# Patient Record
Sex: Male | Born: 1940
Health system: Southern US, Community
[De-identification: ages and names within clinical notes are randomized; demographics above are authoritative.]

## PROBLEM LIST (undated history)

## (undated) DIAGNOSIS — I10 Essential (primary) hypertension: Secondary | ICD-10-CM

## (undated) DIAGNOSIS — I2510 Atherosclerotic heart disease of native coronary artery without angina pectoris: Secondary | ICD-10-CM

## (undated) DIAGNOSIS — E119 Type 2 diabetes mellitus without complications: Secondary | ICD-10-CM

---

## 1999-01-16 ENCOUNTER — Encounter: Payer: Self-pay | Admitting: Orthopedic Surgery

## 1999-01-16 ENCOUNTER — Encounter: Admission: RE | Admit: 1999-01-16 | Discharge: 1999-01-16 | Payer: Self-pay | Admitting: Orthopedic Surgery

## 1999-01-17 ENCOUNTER — Ambulatory Visit (HOSPITAL_BASED_OUTPATIENT_CLINIC_OR_DEPARTMENT_OTHER): Admission: RE | Admit: 1999-01-17 | Discharge: 1999-01-17 | Payer: Self-pay | Admitting: Orthopedic Surgery

## 2002-12-20 ENCOUNTER — Ambulatory Visit (HOSPITAL_COMMUNITY): Admission: RE | Admit: 2002-12-20 | Discharge: 2002-12-20 | Payer: Self-pay | Admitting: Cardiology

## 2002-12-29 ENCOUNTER — Inpatient Hospital Stay (HOSPITAL_COMMUNITY): Admission: RE | Admit: 2002-12-29 | Discharge: 2003-01-02 | Payer: Self-pay | Admitting: Surgery

## 2003-06-20 ENCOUNTER — Emergency Department (HOSPITAL_COMMUNITY): Admission: EM | Admit: 2003-06-20 | Discharge: 2003-06-20 | Payer: Self-pay | Admitting: Emergency Medicine

## 2011-01-15 DIAGNOSIS — H2510 Age-related nuclear cataract, unspecified eye: Secondary | ICD-10-CM | POA: Diagnosis not present

## 2011-01-15 DIAGNOSIS — E119 Type 2 diabetes mellitus without complications: Secondary | ICD-10-CM | POA: Diagnosis not present

## 2011-01-29 DIAGNOSIS — I10 Essential (primary) hypertension: Secondary | ICD-10-CM | POA: Diagnosis not present

## 2011-01-29 DIAGNOSIS — E119 Type 2 diabetes mellitus without complications: Secondary | ICD-10-CM | POA: Diagnosis not present

## 2011-01-29 DIAGNOSIS — E781 Pure hyperglyceridemia: Secondary | ICD-10-CM | POA: Diagnosis not present

## 2011-01-29 DIAGNOSIS — D649 Anemia, unspecified: Secondary | ICD-10-CM | POA: Diagnosis not present

## 2011-01-29 DIAGNOSIS — Z125 Encounter for screening for malignant neoplasm of prostate: Secondary | ICD-10-CM | POA: Diagnosis not present

## 2011-01-30 DIAGNOSIS — Z1212 Encounter for screening for malignant neoplasm of rectum: Secondary | ICD-10-CM | POA: Diagnosis not present

## 2011-01-31 DIAGNOSIS — K922 Gastrointestinal hemorrhage, unspecified: Secondary | ICD-10-CM | POA: Diagnosis not present

## 2011-01-31 DIAGNOSIS — Z0000 Encounter for general adult medical examination without abnormal findings: Secondary | ICD-10-CM | POA: Diagnosis not present

## 2011-01-31 DIAGNOSIS — M1990 Unspecified osteoarthritis, unspecified site: Secondary | ICD-10-CM | POA: Diagnosis not present

## 2011-01-31 DIAGNOSIS — E119 Type 2 diabetes mellitus without complications: Secondary | ICD-10-CM | POA: Diagnosis not present

## 2011-03-11 DIAGNOSIS — D509 Iron deficiency anemia, unspecified: Secondary | ICD-10-CM | POA: Diagnosis not present

## 2011-03-11 DIAGNOSIS — E119 Type 2 diabetes mellitus without complications: Secondary | ICD-10-CM | POA: Diagnosis not present

## 2011-03-17 DIAGNOSIS — K921 Melena: Secondary | ICD-10-CM | POA: Diagnosis not present

## 2011-03-17 DIAGNOSIS — D500 Iron deficiency anemia secondary to blood loss (chronic): Secondary | ICD-10-CM | POA: Diagnosis not present

## 2011-03-27 DIAGNOSIS — D131 Benign neoplasm of stomach: Secondary | ICD-10-CM | POA: Diagnosis not present

## 2011-03-27 DIAGNOSIS — D126 Benign neoplasm of colon, unspecified: Secondary | ICD-10-CM | POA: Diagnosis not present

## 2011-03-27 DIAGNOSIS — K5730 Diverticulosis of large intestine without perforation or abscess without bleeding: Secondary | ICD-10-CM | POA: Diagnosis not present

## 2011-03-27 DIAGNOSIS — R195 Other fecal abnormalities: Secondary | ICD-10-CM | POA: Diagnosis not present

## 2011-03-27 DIAGNOSIS — D509 Iron deficiency anemia, unspecified: Secondary | ICD-10-CM | POA: Diagnosis not present

## 2011-08-15 DIAGNOSIS — H52209 Unspecified astigmatism, unspecified eye: Secondary | ICD-10-CM | POA: Diagnosis not present

## 2011-08-15 DIAGNOSIS — H2510 Age-related nuclear cataract, unspecified eye: Secondary | ICD-10-CM | POA: Diagnosis not present

## 2011-08-15 DIAGNOSIS — H5210 Myopia, unspecified eye: Secondary | ICD-10-CM | POA: Diagnosis not present

## 2011-08-15 DIAGNOSIS — H25019 Cortical age-related cataract, unspecified eye: Secondary | ICD-10-CM | POA: Diagnosis not present

## 2011-09-09 DIAGNOSIS — H2589 Other age-related cataract: Secondary | ICD-10-CM | POA: Diagnosis not present

## 2011-09-09 DIAGNOSIS — H25019 Cortical age-related cataract, unspecified eye: Secondary | ICD-10-CM | POA: Diagnosis not present

## 2011-09-09 DIAGNOSIS — H2510 Age-related nuclear cataract, unspecified eye: Secondary | ICD-10-CM | POA: Diagnosis not present

## 2011-09-09 DIAGNOSIS — IMO0002 Reserved for concepts with insufficient information to code with codable children: Secondary | ICD-10-CM | POA: Diagnosis not present

## 2011-09-23 DIAGNOSIS — H2589 Other age-related cataract: Secondary | ICD-10-CM | POA: Diagnosis not present

## 2011-09-23 DIAGNOSIS — H2510 Age-related nuclear cataract, unspecified eye: Secondary | ICD-10-CM | POA: Diagnosis not present

## 2011-09-23 DIAGNOSIS — H25019 Cortical age-related cataract, unspecified eye: Secondary | ICD-10-CM | POA: Diagnosis not present

## 2012-01-29 DIAGNOSIS — Z125 Encounter for screening for malignant neoplasm of prostate: Secondary | ICD-10-CM | POA: Diagnosis not present

## 2012-01-29 DIAGNOSIS — E781 Pure hyperglyceridemia: Secondary | ICD-10-CM | POA: Diagnosis not present

## 2012-01-29 DIAGNOSIS — E119 Type 2 diabetes mellitus without complications: Secondary | ICD-10-CM | POA: Diagnosis not present

## 2012-01-29 DIAGNOSIS — I2510 Atherosclerotic heart disease of native coronary artery without angina pectoris: Secondary | ICD-10-CM | POA: Diagnosis not present

## 2012-01-29 DIAGNOSIS — I10 Essential (primary) hypertension: Secondary | ICD-10-CM | POA: Diagnosis not present

## 2012-01-30 DIAGNOSIS — I10 Essential (primary) hypertension: Secondary | ICD-10-CM | POA: Diagnosis not present

## 2012-02-04 DIAGNOSIS — Z1212 Encounter for screening for malignant neoplasm of rectum: Secondary | ICD-10-CM | POA: Diagnosis not present

## 2012-02-05 DIAGNOSIS — K922 Gastrointestinal hemorrhage, unspecified: Secondary | ICD-10-CM | POA: Diagnosis not present

## 2012-02-05 DIAGNOSIS — Z1331 Encounter for screening for depression: Secondary | ICD-10-CM | POA: Diagnosis not present

## 2012-02-05 DIAGNOSIS — Z0000 Encounter for general adult medical examination without abnormal findings: Secondary | ICD-10-CM | POA: Diagnosis not present

## 2012-02-05 DIAGNOSIS — E1129 Type 2 diabetes mellitus with other diabetic kidney complication: Secondary | ICD-10-CM | POA: Diagnosis not present

## 2012-02-05 DIAGNOSIS — Z23 Encounter for immunization: Secondary | ICD-10-CM | POA: Diagnosis not present

## 2012-02-13 DIAGNOSIS — Z23 Encounter for immunization: Secondary | ICD-10-CM | POA: Diagnosis not present

## 2012-06-08 DIAGNOSIS — Z6841 Body Mass Index (BMI) 40.0 and over, adult: Secondary | ICD-10-CM | POA: Diagnosis not present

## 2012-06-08 DIAGNOSIS — M1990 Unspecified osteoarthritis, unspecified site: Secondary | ICD-10-CM | POA: Diagnosis not present

## 2012-06-08 DIAGNOSIS — I872 Venous insufficiency (chronic) (peripheral): Secondary | ICD-10-CM | POA: Diagnosis not present

## 2012-06-08 DIAGNOSIS — R809 Proteinuria, unspecified: Secondary | ICD-10-CM | POA: Diagnosis not present

## 2012-06-08 DIAGNOSIS — E1129 Type 2 diabetes mellitus with other diabetic kidney complication: Secondary | ICD-10-CM | POA: Diagnosis not present

## 2012-06-08 DIAGNOSIS — K922 Gastrointestinal hemorrhage, unspecified: Secondary | ICD-10-CM | POA: Diagnosis not present

## 2012-06-08 DIAGNOSIS — I10 Essential (primary) hypertension: Secondary | ICD-10-CM | POA: Diagnosis not present

## 2012-10-19 DIAGNOSIS — Z23 Encounter for immunization: Secondary | ICD-10-CM | POA: Diagnosis not present

## 2012-10-19 DIAGNOSIS — E1129 Type 2 diabetes mellitus with other diabetic kidney complication: Secondary | ICD-10-CM | POA: Diagnosis not present

## 2012-10-19 DIAGNOSIS — E119 Type 2 diabetes mellitus without complications: Secondary | ICD-10-CM | POA: Diagnosis not present

## 2012-10-19 DIAGNOSIS — M1990 Unspecified osteoarthritis, unspecified site: Secondary | ICD-10-CM | POA: Diagnosis not present

## 2012-10-19 DIAGNOSIS — I10 Essential (primary) hypertension: Secondary | ICD-10-CM | POA: Diagnosis not present

## 2012-10-19 DIAGNOSIS — R809 Proteinuria, unspecified: Secondary | ICD-10-CM | POA: Diagnosis not present

## 2013-02-25 DIAGNOSIS — E1129 Type 2 diabetes mellitus with other diabetic kidney complication: Secondary | ICD-10-CM | POA: Diagnosis not present

## 2013-02-25 DIAGNOSIS — E781 Pure hyperglyceridemia: Secondary | ICD-10-CM | POA: Diagnosis not present

## 2013-02-25 DIAGNOSIS — Z125 Encounter for screening for malignant neoplasm of prostate: Secondary | ICD-10-CM | POA: Diagnosis not present

## 2013-02-25 DIAGNOSIS — I2510 Atherosclerotic heart disease of native coronary artery without angina pectoris: Secondary | ICD-10-CM | POA: Diagnosis not present

## 2013-03-04 DIAGNOSIS — Z0000 Encounter for general adult medical examination without abnormal findings: Secondary | ICD-10-CM | POA: Diagnosis not present

## 2013-03-04 DIAGNOSIS — I2510 Atherosclerotic heart disease of native coronary artery without angina pectoris: Secondary | ICD-10-CM | POA: Diagnosis not present

## 2013-03-04 DIAGNOSIS — I10 Essential (primary) hypertension: Secondary | ICD-10-CM | POA: Diagnosis not present

## 2013-03-04 DIAGNOSIS — M1990 Unspecified osteoarthritis, unspecified site: Secondary | ICD-10-CM | POA: Diagnosis not present

## 2013-03-04 DIAGNOSIS — E781 Pure hyperglyceridemia: Secondary | ICD-10-CM | POA: Diagnosis not present

## 2013-03-04 DIAGNOSIS — E1129 Type 2 diabetes mellitus with other diabetic kidney complication: Secondary | ICD-10-CM | POA: Diagnosis not present

## 2013-03-04 DIAGNOSIS — Z1331 Encounter for screening for depression: Secondary | ICD-10-CM | POA: Diagnosis not present

## 2013-03-04 DIAGNOSIS — F4321 Adjustment disorder with depressed mood: Secondary | ICD-10-CM | POA: Diagnosis not present

## 2013-03-04 DIAGNOSIS — I872 Venous insufficiency (chronic) (peripheral): Secondary | ICD-10-CM | POA: Diagnosis not present

## 2013-03-07 DIAGNOSIS — Z1212 Encounter for screening for malignant neoplasm of rectum: Secondary | ICD-10-CM | POA: Diagnosis not present

## 2013-04-27 ENCOUNTER — Ambulatory Visit (HOSPITAL_COMMUNITY)
Admission: RE | Admit: 2013-04-27 | Discharge: 2013-04-27 | Disposition: A | Discharge status: Home / Self Care | Payer: Medicare Other | Source: Ambulatory Visit | Attending: Vascular Surgery | Admitting: Vascular Surgery

## 2013-04-27 ENCOUNTER — Other Ambulatory Visit (HOSPITAL_COMMUNITY): Payer: Self-pay | Admitting: Internal Medicine

## 2013-04-27 DIAGNOSIS — R0989 Other specified symptoms and signs involving the circulatory and respiratory systems: Secondary | ICD-10-CM | POA: Diagnosis not present

## 2014-02-28 DIAGNOSIS — I10 Essential (primary) hypertension: Secondary | ICD-10-CM | POA: Diagnosis not present

## 2014-02-28 DIAGNOSIS — I2510 Atherosclerotic heart disease of native coronary artery without angina pectoris: Secondary | ICD-10-CM | POA: Diagnosis not present

## 2014-02-28 DIAGNOSIS — E785 Hyperlipidemia, unspecified: Secondary | ICD-10-CM | POA: Diagnosis not present

## 2014-02-28 DIAGNOSIS — Z125 Encounter for screening for malignant neoplasm of prostate: Secondary | ICD-10-CM | POA: Diagnosis not present

## 2014-02-28 DIAGNOSIS — E1129 Type 2 diabetes mellitus with other diabetic kidney complication: Secondary | ICD-10-CM | POA: Diagnosis not present

## 2014-03-07 DIAGNOSIS — I872 Venous insufficiency (chronic) (peripheral): Secondary | ICD-10-CM | POA: Diagnosis not present

## 2014-03-07 DIAGNOSIS — G459 Transient cerebral ischemic attack, unspecified: Secondary | ICD-10-CM | POA: Diagnosis not present

## 2014-03-07 DIAGNOSIS — K635 Polyp of colon: Secondary | ICD-10-CM | POA: Insufficient documentation

## 2014-03-07 DIAGNOSIS — I2510 Atherosclerotic heart disease of native coronary artery without angina pectoris: Secondary | ICD-10-CM | POA: Diagnosis not present

## 2014-03-07 DIAGNOSIS — E781 Pure hyperglyceridemia: Secondary | ICD-10-CM | POA: Diagnosis not present

## 2014-03-07 DIAGNOSIS — F4321 Adjustment disorder with depressed mood: Secondary | ICD-10-CM | POA: Diagnosis not present

## 2014-03-07 DIAGNOSIS — Z1389 Encounter for screening for other disorder: Secondary | ICD-10-CM | POA: Diagnosis not present

## 2014-03-07 DIAGNOSIS — Z23 Encounter for immunization: Secondary | ICD-10-CM | POA: Diagnosis not present

## 2014-03-07 DIAGNOSIS — Z008 Encounter for other general examination: Secondary | ICD-10-CM | POA: Diagnosis not present

## 2014-03-07 DIAGNOSIS — I10 Essential (primary) hypertension: Secondary | ICD-10-CM | POA: Diagnosis not present

## 2014-03-08 DIAGNOSIS — Z1212 Encounter for screening for malignant neoplasm of rectum: Secondary | ICD-10-CM | POA: Diagnosis not present

## 2015-03-28 DIAGNOSIS — E781 Pure hyperglyceridemia: Secondary | ICD-10-CM | POA: Diagnosis not present

## 2015-03-28 DIAGNOSIS — E1129 Type 2 diabetes mellitus with other diabetic kidney complication: Secondary | ICD-10-CM | POA: Diagnosis not present

## 2015-03-28 DIAGNOSIS — Z125 Encounter for screening for malignant neoplasm of prostate: Secondary | ICD-10-CM | POA: Diagnosis not present

## 2015-03-28 DIAGNOSIS — I10 Essential (primary) hypertension: Secondary | ICD-10-CM | POA: Diagnosis not present

## 2015-03-28 DIAGNOSIS — Z0000 Encounter for general adult medical examination without abnormal findings: Secondary | ICD-10-CM | POA: Insufficient documentation

## 2015-04-04 DIAGNOSIS — E781 Pure hyperglyceridemia: Secondary | ICD-10-CM | POA: Diagnosis not present

## 2015-04-04 DIAGNOSIS — I10 Essential (primary) hypertension: Secondary | ICD-10-CM | POA: Diagnosis not present

## 2015-04-04 DIAGNOSIS — I6523 Occlusion and stenosis of bilateral carotid arteries: Secondary | ICD-10-CM | POA: Diagnosis not present

## 2015-04-04 DIAGNOSIS — E1159 Type 2 diabetes mellitus with other circulatory complications: Secondary | ICD-10-CM | POA: Diagnosis not present

## 2015-04-04 DIAGNOSIS — G458 Other transient cerebral ischemic attacks and related syndromes: Secondary | ICD-10-CM | POA: Diagnosis not present

## 2015-04-04 DIAGNOSIS — M1990 Unspecified osteoarthritis, unspecified site: Secondary | ICD-10-CM | POA: Diagnosis not present

## 2015-04-04 DIAGNOSIS — Z0000 Encounter for general adult medical examination without abnormal findings: Secondary | ICD-10-CM | POA: Diagnosis not present

## 2015-04-04 DIAGNOSIS — F39 Unspecified mood [affective] disorder: Secondary | ICD-10-CM | POA: Insufficient documentation

## 2015-04-04 DIAGNOSIS — I209 Angina pectoris, unspecified: Secondary | ICD-10-CM | POA: Insufficient documentation

## 2015-04-04 DIAGNOSIS — Z1389 Encounter for screening for other disorder: Secondary | ICD-10-CM | POA: Diagnosis not present

## 2015-04-04 DIAGNOSIS — Z6841 Body Mass Index (BMI) 40.0 and over, adult: Secondary | ICD-10-CM | POA: Diagnosis not present

## 2015-04-04 DIAGNOSIS — I872 Venous insufficiency (chronic) (peripheral): Secondary | ICD-10-CM | POA: Diagnosis not present

## 2015-04-04 DIAGNOSIS — I25119 Atherosclerotic heart disease of native coronary artery with unspecified angina pectoris: Secondary | ICD-10-CM | POA: Diagnosis not present

## 2015-04-11 DIAGNOSIS — Z1212 Encounter for screening for malignant neoplasm of rectum: Secondary | ICD-10-CM | POA: Diagnosis not present

## 2015-05-03 DIAGNOSIS — Z6841 Body Mass Index (BMI) 40.0 and over, adult: Secondary | ICD-10-CM | POA: Diagnosis not present

## 2015-05-03 DIAGNOSIS — I2510 Atherosclerotic heart disease of native coronary artery without angina pectoris: Secondary | ICD-10-CM | POA: Diagnosis not present

## 2015-05-03 DIAGNOSIS — I10 Essential (primary) hypertension: Secondary | ICD-10-CM | POA: Diagnosis not present

## 2015-05-03 DIAGNOSIS — E1159 Type 2 diabetes mellitus with other circulatory complications: Secondary | ICD-10-CM | POA: Diagnosis not present

## 2015-08-01 DIAGNOSIS — E781 Pure hyperglyceridemia: Secondary | ICD-10-CM | POA: Diagnosis not present

## 2015-08-01 DIAGNOSIS — E1159 Type 2 diabetes mellitus with other circulatory complications: Secondary | ICD-10-CM | POA: Diagnosis not present

## 2015-08-01 DIAGNOSIS — F39 Unspecified mood [affective] disorder: Secondary | ICD-10-CM | POA: Diagnosis not present

## 2015-08-01 DIAGNOSIS — M1990 Unspecified osteoarthritis, unspecified site: Secondary | ICD-10-CM | POA: Diagnosis not present

## 2015-08-01 DIAGNOSIS — I25119 Atherosclerotic heart disease of native coronary artery with unspecified angina pectoris: Secondary | ICD-10-CM | POA: Diagnosis not present

## 2015-09-05 DIAGNOSIS — Z0100 Encounter for examination of eyes and vision without abnormal findings: Secondary | ICD-10-CM | POA: Diagnosis not present

## 2015-09-05 DIAGNOSIS — E119 Type 2 diabetes mellitus without complications: Secondary | ICD-10-CM | POA: Diagnosis not present

## 2015-09-05 DIAGNOSIS — Z961 Presence of intraocular lens: Secondary | ICD-10-CM | POA: Diagnosis not present

## 2015-12-10 DIAGNOSIS — I25119 Atherosclerotic heart disease of native coronary artery with unspecified angina pectoris: Secondary | ICD-10-CM | POA: Diagnosis not present

## 2015-12-10 DIAGNOSIS — E1151 Type 2 diabetes mellitus with diabetic peripheral angiopathy without gangrene: Secondary | ICD-10-CM | POA: Diagnosis not present

## 2015-12-10 DIAGNOSIS — Z6839 Body mass index (BMI) 39.0-39.9, adult: Secondary | ICD-10-CM | POA: Diagnosis not present

## 2015-12-10 DIAGNOSIS — I10 Essential (primary) hypertension: Secondary | ICD-10-CM | POA: Diagnosis not present

## 2015-12-10 DIAGNOSIS — M1990 Unspecified osteoarthritis, unspecified site: Secondary | ICD-10-CM | POA: Diagnosis not present

## 2015-12-10 DIAGNOSIS — E781 Pure hyperglyceridemia: Secondary | ICD-10-CM | POA: Diagnosis not present

## 2015-12-10 DIAGNOSIS — F39 Unspecified mood [affective] disorder: Secondary | ICD-10-CM | POA: Diagnosis not present

## 2015-12-10 DIAGNOSIS — E1159 Type 2 diabetes mellitus with other circulatory complications: Secondary | ICD-10-CM | POA: Diagnosis not present

## 2016-04-09 DIAGNOSIS — Z125 Encounter for screening for malignant neoplasm of prostate: Secondary | ICD-10-CM | POA: Diagnosis not present

## 2016-04-09 DIAGNOSIS — I10 Essential (primary) hypertension: Secondary | ICD-10-CM | POA: Diagnosis not present

## 2016-04-09 DIAGNOSIS — E1159 Type 2 diabetes mellitus with other circulatory complications: Secondary | ICD-10-CM | POA: Diagnosis not present

## 2016-04-09 DIAGNOSIS — E781 Pure hyperglyceridemia: Secondary | ICD-10-CM | POA: Diagnosis not present

## 2016-04-16 DIAGNOSIS — Z6838 Body mass index (BMI) 38.0-38.9, adult: Secondary | ICD-10-CM | POA: Diagnosis not present

## 2016-04-16 DIAGNOSIS — E1151 Type 2 diabetes mellitus with diabetic peripheral angiopathy without gangrene: Secondary | ICD-10-CM | POA: Diagnosis not present

## 2016-04-16 DIAGNOSIS — I25119 Atherosclerotic heart disease of native coronary artery with unspecified angina pectoris: Secondary | ICD-10-CM | POA: Diagnosis not present

## 2016-04-16 DIAGNOSIS — I6523 Occlusion and stenosis of bilateral carotid arteries: Secondary | ICD-10-CM | POA: Diagnosis not present

## 2016-04-16 DIAGNOSIS — K635 Polyp of colon: Secondary | ICD-10-CM | POA: Diagnosis not present

## 2016-04-16 DIAGNOSIS — I872 Venous insufficiency (chronic) (peripheral): Secondary | ICD-10-CM | POA: Diagnosis not present

## 2016-04-16 DIAGNOSIS — Z1389 Encounter for screening for other disorder: Secondary | ICD-10-CM | POA: Diagnosis not present

## 2016-04-16 DIAGNOSIS — F39 Unspecified mood [affective] disorder: Secondary | ICD-10-CM | POA: Diagnosis not present

## 2016-04-16 DIAGNOSIS — M1990 Unspecified osteoarthritis, unspecified site: Secondary | ICD-10-CM | POA: Diagnosis not present

## 2016-04-16 DIAGNOSIS — Z0000 Encounter for general adult medical examination without abnormal findings: Secondary | ICD-10-CM | POA: Diagnosis not present

## 2016-04-16 DIAGNOSIS — E781 Pure hyperglyceridemia: Secondary | ICD-10-CM | POA: Diagnosis not present

## 2016-04-16 DIAGNOSIS — Z23 Encounter for immunization: Secondary | ICD-10-CM | POA: Diagnosis not present

## 2016-04-25 DIAGNOSIS — Z1212 Encounter for screening for malignant neoplasm of rectum: Secondary | ICD-10-CM | POA: Diagnosis not present

## 2016-09-11 DIAGNOSIS — E781 Pure hyperglyceridemia: Secondary | ICD-10-CM | POA: Diagnosis not present

## 2016-09-11 DIAGNOSIS — Z6839 Body mass index (BMI) 39.0-39.9, adult: Secondary | ICD-10-CM | POA: Diagnosis not present

## 2016-09-11 DIAGNOSIS — E1151 Type 2 diabetes mellitus with diabetic peripheral angiopathy without gangrene: Secondary | ICD-10-CM | POA: Diagnosis not present

## 2016-09-11 DIAGNOSIS — I25119 Atherosclerotic heart disease of native coronary artery with unspecified angina pectoris: Secondary | ICD-10-CM | POA: Diagnosis not present

## 2016-09-11 DIAGNOSIS — I10 Essential (primary) hypertension: Secondary | ICD-10-CM | POA: Diagnosis not present

## 2017-01-14 DIAGNOSIS — H5203 Hypermetropia, bilateral: Secondary | ICD-10-CM | POA: Diagnosis not present

## 2017-01-14 DIAGNOSIS — E119 Type 2 diabetes mellitus without complications: Secondary | ICD-10-CM | POA: Diagnosis not present

## 2017-01-14 DIAGNOSIS — Z961 Presence of intraocular lens: Secondary | ICD-10-CM | POA: Diagnosis not present

## 2017-01-22 DIAGNOSIS — I10 Essential (primary) hypertension: Secondary | ICD-10-CM | POA: Diagnosis not present

## 2017-01-22 DIAGNOSIS — M1990 Unspecified osteoarthritis, unspecified site: Secondary | ICD-10-CM | POA: Diagnosis not present

## 2017-01-22 DIAGNOSIS — E781 Pure hyperglyceridemia: Secondary | ICD-10-CM | POA: Diagnosis not present

## 2017-01-22 DIAGNOSIS — E1151 Type 2 diabetes mellitus with diabetic peripheral angiopathy without gangrene: Secondary | ICD-10-CM | POA: Diagnosis not present

## 2017-01-22 DIAGNOSIS — Z1389 Encounter for screening for other disorder: Secondary | ICD-10-CM | POA: Diagnosis not present

## 2017-01-22 DIAGNOSIS — I25119 Atherosclerotic heart disease of native coronary artery with unspecified angina pectoris: Secondary | ICD-10-CM | POA: Diagnosis not present

## 2017-01-22 DIAGNOSIS — Z6839 Body mass index (BMI) 39.0-39.9, adult: Secondary | ICD-10-CM | POA: Diagnosis not present

## 2017-04-17 DIAGNOSIS — Z125 Encounter for screening for malignant neoplasm of prostate: Secondary | ICD-10-CM | POA: Diagnosis not present

## 2017-04-17 DIAGNOSIS — E1151 Type 2 diabetes mellitus with diabetic peripheral angiopathy without gangrene: Secondary | ICD-10-CM | POA: Diagnosis not present

## 2017-04-17 DIAGNOSIS — R82998 Other abnormal findings in urine: Secondary | ICD-10-CM | POA: Diagnosis not present

## 2017-04-17 DIAGNOSIS — I10 Essential (primary) hypertension: Secondary | ICD-10-CM | POA: Diagnosis not present

## 2017-04-24 DIAGNOSIS — Z6841 Body Mass Index (BMI) 40.0 and over, adult: Secondary | ICD-10-CM | POA: Diagnosis not present

## 2017-04-24 DIAGNOSIS — Z8673 Personal history of transient ischemic attack (TIA), and cerebral infarction without residual deficits: Secondary | ICD-10-CM | POA: Diagnosis not present

## 2017-04-24 DIAGNOSIS — I25119 Atherosclerotic heart disease of native coronary artery with unspecified angina pectoris: Secondary | ICD-10-CM | POA: Diagnosis not present

## 2017-04-24 DIAGNOSIS — F39 Unspecified mood [affective] disorder: Secondary | ICD-10-CM | POA: Diagnosis not present

## 2017-04-24 DIAGNOSIS — Z0000 Encounter for general adult medical examination without abnormal findings: Secondary | ICD-10-CM | POA: Diagnosis not present

## 2017-04-24 DIAGNOSIS — I872 Venous insufficiency (chronic) (peripheral): Secondary | ICD-10-CM | POA: Diagnosis not present

## 2017-04-24 DIAGNOSIS — E1151 Type 2 diabetes mellitus with diabetic peripheral angiopathy without gangrene: Secondary | ICD-10-CM | POA: Diagnosis not present

## 2017-04-24 DIAGNOSIS — I10 Essential (primary) hypertension: Secondary | ICD-10-CM | POA: Diagnosis not present

## 2017-04-24 DIAGNOSIS — I6523 Occlusion and stenosis of bilateral carotid arteries: Secondary | ICD-10-CM | POA: Diagnosis not present

## 2017-04-24 DIAGNOSIS — E781 Pure hyperglyceridemia: Secondary | ICD-10-CM | POA: Diagnosis not present

## 2017-04-24 DIAGNOSIS — K635 Polyp of colon: Secondary | ICD-10-CM | POA: Diagnosis not present

## 2017-04-27 DIAGNOSIS — Z1212 Encounter for screening for malignant neoplasm of rectum: Secondary | ICD-10-CM | POA: Diagnosis not present

## 2017-05-04 ENCOUNTER — Other Ambulatory Visit (HOSPITAL_COMMUNITY): Payer: Self-pay | Admitting: Internal Medicine

## 2017-05-04 DIAGNOSIS — I6529 Occlusion and stenosis of unspecified carotid artery: Secondary | ICD-10-CM

## 2017-05-05 ENCOUNTER — Ambulatory Visit (HOSPITAL_COMMUNITY)
Admission: RE | Admit: 2017-05-05 | Discharge: 2017-05-05 | Disposition: A | Discharge status: Home / Self Care | Payer: PPO | Source: Ambulatory Visit | Attending: Vascular Surgery | Admitting: Vascular Surgery

## 2017-05-05 DIAGNOSIS — I6529 Occlusion and stenosis of unspecified carotid artery: Secondary | ICD-10-CM | POA: Diagnosis not present

## 2017-09-03 DIAGNOSIS — F1120 Opioid dependence, uncomplicated: Secondary | ICD-10-CM | POA: Insufficient documentation

## 2017-09-03 DIAGNOSIS — I10 Essential (primary) hypertension: Secondary | ICD-10-CM | POA: Diagnosis not present

## 2017-09-03 DIAGNOSIS — I25119 Atherosclerotic heart disease of native coronary artery with unspecified angina pectoris: Secondary | ICD-10-CM | POA: Diagnosis not present

## 2017-09-03 DIAGNOSIS — F39 Unspecified mood [affective] disorder: Secondary | ICD-10-CM | POA: Diagnosis not present

## 2017-09-03 DIAGNOSIS — Z6841 Body Mass Index (BMI) 40.0 and over, adult: Secondary | ICD-10-CM | POA: Diagnosis not present

## 2017-09-03 DIAGNOSIS — E1151 Type 2 diabetes mellitus with diabetic peripheral angiopathy without gangrene: Secondary | ICD-10-CM | POA: Diagnosis not present

## 2017-09-03 DIAGNOSIS — M1990 Unspecified osteoarthritis, unspecified site: Secondary | ICD-10-CM | POA: Diagnosis not present

## 2017-09-03 DIAGNOSIS — I6523 Occlusion and stenosis of bilateral carotid arteries: Secondary | ICD-10-CM | POA: Diagnosis not present

## 2018-05-10 DIAGNOSIS — R82998 Other abnormal findings in urine: Secondary | ICD-10-CM | POA: Diagnosis not present

## 2018-05-10 DIAGNOSIS — E781 Pure hyperglyceridemia: Secondary | ICD-10-CM | POA: Diagnosis not present

## 2018-05-10 DIAGNOSIS — Z125 Encounter for screening for malignant neoplasm of prostate: Secondary | ICD-10-CM | POA: Diagnosis not present

## 2018-05-10 DIAGNOSIS — I10 Essential (primary) hypertension: Secondary | ICD-10-CM | POA: Diagnosis not present

## 2018-05-10 DIAGNOSIS — E1151 Type 2 diabetes mellitus with diabetic peripheral angiopathy without gangrene: Secondary | ICD-10-CM | POA: Diagnosis not present

## 2018-05-17 DIAGNOSIS — Z1331 Encounter for screening for depression: Secondary | ICD-10-CM | POA: Diagnosis not present

## 2018-05-17 DIAGNOSIS — Z1339 Encounter for screening examination for other mental health and behavioral disorders: Secondary | ICD-10-CM | POA: Diagnosis not present

## 2018-05-17 DIAGNOSIS — E781 Pure hyperglyceridemia: Secondary | ICD-10-CM | POA: Diagnosis not present

## 2018-05-17 DIAGNOSIS — I10 Essential (primary) hypertension: Secondary | ICD-10-CM | POA: Diagnosis not present

## 2018-05-17 DIAGNOSIS — G459 Transient cerebral ischemic attack, unspecified: Secondary | ICD-10-CM | POA: Diagnosis not present

## 2018-05-17 DIAGNOSIS — M1990 Unspecified osteoarthritis, unspecified site: Secondary | ICD-10-CM | POA: Diagnosis not present

## 2018-05-17 DIAGNOSIS — I25119 Atherosclerotic heart disease of native coronary artery with unspecified angina pectoris: Secondary | ICD-10-CM | POA: Diagnosis not present

## 2018-05-17 DIAGNOSIS — I6523 Occlusion and stenosis of bilateral carotid arteries: Secondary | ICD-10-CM | POA: Diagnosis not present

## 2018-05-17 DIAGNOSIS — F1120 Opioid dependence, uncomplicated: Secondary | ICD-10-CM | POA: Diagnosis not present

## 2018-05-17 DIAGNOSIS — I872 Venous insufficiency (chronic) (peripheral): Secondary | ICD-10-CM | POA: Diagnosis not present

## 2018-05-17 DIAGNOSIS — Z0000 Encounter for general adult medical examination without abnormal findings: Secondary | ICD-10-CM | POA: Diagnosis not present

## 2018-05-17 DIAGNOSIS — E1151 Type 2 diabetes mellitus with diabetic peripheral angiopathy without gangrene: Secondary | ICD-10-CM | POA: Diagnosis not present

## 2018-05-17 DIAGNOSIS — F39 Unspecified mood [affective] disorder: Secondary | ICD-10-CM | POA: Diagnosis not present

## 2018-05-17 DIAGNOSIS — N390 Urinary tract infection, site not specified: Secondary | ICD-10-CM | POA: Insufficient documentation

## 2018-08-25 ENCOUNTER — Encounter: Payer: Self-pay | Admitting: Podiatry

## 2018-08-25 ENCOUNTER — Ambulatory Visit: Payer: PPO | Admitting: Podiatry

## 2018-08-25 ENCOUNTER — Other Ambulatory Visit: Payer: Self-pay

## 2018-08-25 DIAGNOSIS — E1140 Type 2 diabetes mellitus with diabetic neuropathy, unspecified: Secondary | ICD-10-CM | POA: Insufficient documentation

## 2018-08-25 DIAGNOSIS — E1142 Type 2 diabetes mellitus with diabetic polyneuropathy: Secondary | ICD-10-CM

## 2018-08-25 NOTE — Progress Notes (Signed)
This patients presents to the office saying his feet feel funny when walking.  He says he needs supports in his shoes to balance his feet.  Patient says  he has diabetic neuropathy.  He presents to the office to discuss insoles for his feet. Patient presents to the office with his wife.  Vascular  Dorsalis pedis and posterior tibial pulses are palpable  B/L.  Capillary return  WNL.  Temperature gradient is  WNL.  Skin turgor  WNL  Sensorium  Senn Weinstein monofilament wire test is absent.. Absent tactile sensation.  Nail Exam  Patient has normal nails with no evidence of bacterial or fungal infection.  Orthopedic  Exam  Muscle tone and muscle strength  WNL.  No limitations of motion feet  B/L.  No crepitus or joint effusion noted.  Hammer toes  B/L.  Pes planus.  Pain in her  Skin  No open lesions.  Normal skin texture and turgor.  Pes planus  Diabetic neuropathy  IE.  Discussed this condition with this patient.  Examined his shoes and history is same more than adequate.  Told him that he would benefit from receiving diabetic insoles to be placed in his shoes.  He was told he does qualify for diabetic shoes due to DPN and pes planus and hammer toes.  Patient was told he needs to be seen by his medical doctor prior to receiving diabetic shoes.  Patient to make an appointment with Liliane Channel.   Gardiner Barefoot DPM  .

## 2018-09-09 ENCOUNTER — Other Ambulatory Visit: Payer: PPO | Admitting: Orthotics

## 2018-10-08 ENCOUNTER — Ambulatory Visit: Payer: Medicare Other | Admitting: Podiatry

## 2018-11-24 DIAGNOSIS — E781 Pure hyperglyceridemia: Secondary | ICD-10-CM | POA: Diagnosis not present

## 2018-11-24 DIAGNOSIS — I25119 Atherosclerotic heart disease of native coronary artery with unspecified angina pectoris: Secondary | ICD-10-CM | POA: Diagnosis not present

## 2018-11-24 DIAGNOSIS — I10 Essential (primary) hypertension: Secondary | ICD-10-CM | POA: Diagnosis not present

## 2018-11-24 DIAGNOSIS — M1990 Unspecified osteoarthritis, unspecified site: Secondary | ICD-10-CM | POA: Diagnosis not present

## 2018-11-24 DIAGNOSIS — E1151 Type 2 diabetes mellitus with diabetic peripheral angiopathy without gangrene: Secondary | ICD-10-CM | POA: Diagnosis not present

## 2018-11-24 DIAGNOSIS — F39 Unspecified mood [affective] disorder: Secondary | ICD-10-CM | POA: Diagnosis not present

## 2019-01-21 DIAGNOSIS — E119 Type 2 diabetes mellitus without complications: Secondary | ICD-10-CM | POA: Diagnosis not present

## 2019-01-21 DIAGNOSIS — Z961 Presence of intraocular lens: Secondary | ICD-10-CM | POA: Diagnosis not present

## 2019-01-21 DIAGNOSIS — H26493 Other secondary cataract, bilateral: Secondary | ICD-10-CM | POA: Diagnosis not present

## 2019-05-20 DIAGNOSIS — E1151 Type 2 diabetes mellitus with diabetic peripheral angiopathy without gangrene: Secondary | ICD-10-CM | POA: Diagnosis not present

## 2019-05-20 DIAGNOSIS — Z125 Encounter for screening for malignant neoplasm of prostate: Secondary | ICD-10-CM | POA: Diagnosis not present

## 2019-05-20 DIAGNOSIS — E781 Pure hyperglyceridemia: Secondary | ICD-10-CM | POA: Diagnosis not present

## 2019-05-23 DIAGNOSIS — R82998 Other abnormal findings in urine: Secondary | ICD-10-CM | POA: Diagnosis not present

## 2019-05-23 DIAGNOSIS — I10 Essential (primary) hypertension: Secondary | ICD-10-CM | POA: Diagnosis not present

## 2019-05-23 DIAGNOSIS — Z1212 Encounter for screening for malignant neoplasm of rectum: Secondary | ICD-10-CM | POA: Diagnosis not present

## 2019-05-25 DIAGNOSIS — F39 Unspecified mood [affective] disorder: Secondary | ICD-10-CM | POA: Diagnosis not present

## 2019-05-25 DIAGNOSIS — I6523 Occlusion and stenosis of bilateral carotid arteries: Secondary | ICD-10-CM | POA: Diagnosis not present

## 2019-05-25 DIAGNOSIS — I10 Essential (primary) hypertension: Secondary | ICD-10-CM | POA: Diagnosis not present

## 2019-05-25 DIAGNOSIS — E1151 Type 2 diabetes mellitus with diabetic peripheral angiopathy without gangrene: Secondary | ICD-10-CM | POA: Diagnosis not present

## 2019-05-25 DIAGNOSIS — I25119 Atherosclerotic heart disease of native coronary artery with unspecified angina pectoris: Secondary | ICD-10-CM | POA: Diagnosis not present

## 2019-05-25 DIAGNOSIS — F1120 Opioid dependence, uncomplicated: Secondary | ICD-10-CM | POA: Diagnosis not present

## 2019-05-25 DIAGNOSIS — E781 Pure hyperglyceridemia: Secondary | ICD-10-CM | POA: Diagnosis not present

## 2019-05-25 DIAGNOSIS — I872 Venous insufficiency (chronic) (peripheral): Secondary | ICD-10-CM | POA: Diagnosis not present

## 2019-05-25 DIAGNOSIS — Z1331 Encounter for screening for depression: Secondary | ICD-10-CM | POA: Diagnosis not present

## 2019-05-25 DIAGNOSIS — M1990 Unspecified osteoarthritis, unspecified site: Secondary | ICD-10-CM | POA: Diagnosis not present

## 2019-05-25 DIAGNOSIS — Z0000 Encounter for general adult medical examination without abnormal findings: Secondary | ICD-10-CM | POA: Diagnosis not present

## 2019-05-25 DIAGNOSIS — G459 Transient cerebral ischemic attack, unspecified: Secondary | ICD-10-CM | POA: Diagnosis not present

## 2019-12-30 DIAGNOSIS — I25119 Atherosclerotic heart disease of native coronary artery with unspecified angina pectoris: Secondary | ICD-10-CM | POA: Diagnosis not present

## 2019-12-30 DIAGNOSIS — G459 Transient cerebral ischemic attack, unspecified: Secondary | ICD-10-CM | POA: Diagnosis not present

## 2019-12-30 DIAGNOSIS — F39 Unspecified mood [affective] disorder: Secondary | ICD-10-CM | POA: Diagnosis not present

## 2019-12-30 DIAGNOSIS — F1120 Opioid dependence, uncomplicated: Secondary | ICD-10-CM | POA: Diagnosis not present

## 2019-12-30 DIAGNOSIS — K635 Polyp of colon: Secondary | ICD-10-CM | POA: Diagnosis not present

## 2019-12-30 DIAGNOSIS — E781 Pure hyperglyceridemia: Secondary | ICD-10-CM | POA: Diagnosis not present

## 2019-12-30 DIAGNOSIS — M1990 Unspecified osteoarthritis, unspecified site: Secondary | ICD-10-CM | POA: Diagnosis not present

## 2019-12-30 DIAGNOSIS — I872 Venous insufficiency (chronic) (peripheral): Secondary | ICD-10-CM | POA: Diagnosis not present

## 2019-12-30 DIAGNOSIS — I10 Essential (primary) hypertension: Secondary | ICD-10-CM | POA: Diagnosis not present

## 2019-12-30 DIAGNOSIS — E1151 Type 2 diabetes mellitus with diabetic peripheral angiopathy without gangrene: Secondary | ICD-10-CM | POA: Diagnosis not present

## 2020-01-26 DIAGNOSIS — E113291 Type 2 diabetes mellitus with mild nonproliferative diabetic retinopathy without macular edema, right eye: Secondary | ICD-10-CM | POA: Diagnosis not present

## 2020-01-26 DIAGNOSIS — H26493 Other secondary cataract, bilateral: Secondary | ICD-10-CM | POA: Diagnosis not present

## 2020-01-26 DIAGNOSIS — Z961 Presence of intraocular lens: Secondary | ICD-10-CM | POA: Diagnosis not present

## 2020-05-28 ENCOUNTER — Encounter: Payer: Self-pay | Admitting: Emergency Medicine

## 2020-05-28 ENCOUNTER — Inpatient Hospital Stay
Admission: EM | Admit: 2020-05-28 | Discharge: 2020-05-30 | DRG: 871 | Disposition: A | Discharge status: Home Health | Payer: PPO | Attending: Internal Medicine | Admitting: Internal Medicine

## 2020-05-28 ENCOUNTER — Emergency Department: Payer: PPO

## 2020-05-28 ENCOUNTER — Other Ambulatory Visit: Payer: Self-pay

## 2020-05-28 DIAGNOSIS — Z7984 Long term (current) use of oral hypoglycemic drugs: Secondary | ICD-10-CM

## 2020-05-28 DIAGNOSIS — R0902 Hypoxemia: Secondary | ICD-10-CM | POA: Diagnosis not present

## 2020-05-28 DIAGNOSIS — E785 Hyperlipidemia, unspecified: Secondary | ICD-10-CM | POA: Diagnosis not present

## 2020-05-28 DIAGNOSIS — N179 Acute kidney failure, unspecified: Secondary | ICD-10-CM | POA: Diagnosis not present

## 2020-05-28 DIAGNOSIS — E119 Type 2 diabetes mellitus without complications: Secondary | ICD-10-CM | POA: Diagnosis not present

## 2020-05-28 DIAGNOSIS — Z9181 History of falling: Secondary | ICD-10-CM | POA: Diagnosis not present

## 2020-05-28 DIAGNOSIS — Z87891 Personal history of nicotine dependence: Secondary | ICD-10-CM

## 2020-05-28 DIAGNOSIS — Z6841 Body Mass Index (BMI) 40.0 and over, adult: Secondary | ICD-10-CM | POA: Diagnosis not present

## 2020-05-28 DIAGNOSIS — R6520 Severe sepsis without septic shock: Secondary | ICD-10-CM | POA: Diagnosis present

## 2020-05-28 DIAGNOSIS — E1165 Type 2 diabetes mellitus with hyperglycemia: Secondary | ICD-10-CM | POA: Diagnosis not present

## 2020-05-28 DIAGNOSIS — R748 Abnormal levels of other serum enzymes: Secondary | ICD-10-CM | POA: Diagnosis not present

## 2020-05-28 DIAGNOSIS — Z951 Presence of aortocoronary bypass graft: Secondary | ICD-10-CM

## 2020-05-28 DIAGNOSIS — M7989 Other specified soft tissue disorders: Secondary | ICD-10-CM | POA: Diagnosis not present

## 2020-05-28 DIAGNOSIS — E872 Acidosis, unspecified: Secondary | ICD-10-CM | POA: Diagnosis present

## 2020-05-28 DIAGNOSIS — Z20822 Contact with and (suspected) exposure to covid-19: Secondary | ICD-10-CM | POA: Diagnosis not present

## 2020-05-28 DIAGNOSIS — Z79899 Other long term (current) drug therapy: Secondary | ICD-10-CM

## 2020-05-28 DIAGNOSIS — R531 Weakness: Secondary | ICD-10-CM

## 2020-05-28 DIAGNOSIS — I110 Hypertensive heart disease with heart failure: Secondary | ICD-10-CM | POA: Diagnosis present

## 2020-05-28 DIAGNOSIS — W19XXXA Unspecified fall, initial encounter: Secondary | ICD-10-CM

## 2020-05-28 DIAGNOSIS — J810 Acute pulmonary edema: Secondary | ICD-10-CM | POA: Diagnosis not present

## 2020-05-28 DIAGNOSIS — I248 Other forms of acute ischemic heart disease: Secondary | ICD-10-CM | POA: Diagnosis not present

## 2020-05-28 DIAGNOSIS — I5031 Acute diastolic (congestive) heart failure: Secondary | ICD-10-CM | POA: Diagnosis not present

## 2020-05-28 DIAGNOSIS — R778 Other specified abnormalities of plasma proteins: Secondary | ICD-10-CM

## 2020-05-28 DIAGNOSIS — Z955 Presence of coronary angioplasty implant and graft: Secondary | ICD-10-CM

## 2020-05-28 DIAGNOSIS — A419 Sepsis, unspecified organism: Principal | ICD-10-CM | POA: Diagnosis present

## 2020-05-28 DIAGNOSIS — Z66 Do not resuscitate: Secondary | ICD-10-CM | POA: Diagnosis not present

## 2020-05-28 DIAGNOSIS — L03116 Cellulitis of left lower limb: Secondary | ICD-10-CM | POA: Diagnosis not present

## 2020-05-28 DIAGNOSIS — I5033 Acute on chronic diastolic (congestive) heart failure: Secondary | ICD-10-CM | POA: Diagnosis present

## 2020-05-28 DIAGNOSIS — I2510 Atherosclerotic heart disease of native coronary artery without angina pectoris: Secondary | ICD-10-CM | POA: Diagnosis not present

## 2020-05-28 DIAGNOSIS — I10 Essential (primary) hypertension: Secondary | ICD-10-CM

## 2020-05-28 DIAGNOSIS — I440 Atrioventricular block, first degree: Secondary | ICD-10-CM | POA: Diagnosis not present

## 2020-05-28 DIAGNOSIS — M79672 Pain in left foot: Secondary | ICD-10-CM | POA: Diagnosis present

## 2020-05-28 DIAGNOSIS — R609 Edema, unspecified: Secondary | ICD-10-CM | POA: Diagnosis not present

## 2020-05-28 DIAGNOSIS — Z043 Encounter for examination and observation following other accident: Secondary | ICD-10-CM | POA: Diagnosis not present

## 2020-05-28 HISTORY — DX: Type 2 diabetes mellitus without complications: E11.9

## 2020-05-28 HISTORY — DX: Essential (primary) hypertension: I10

## 2020-05-28 HISTORY — DX: Atherosclerotic heart disease of native coronary artery without angina pectoris: I25.10

## 2020-05-28 LAB — URINALYSIS, COMPLETE (UACMP) WITH MICROSCOPIC
Bacteria, UA: NONE SEEN
Bilirubin Urine: NEGATIVE
Glucose, UA: 50 mg/dL — AB
Ketones, ur: 5 mg/dL — AB
Leukocytes,Ua: NEGATIVE
Nitrite: NEGATIVE
Protein, ur: 30 mg/dL — AB
Specific Gravity, Urine: 1.015 (ref 1.005–1.030)
Squamous Epithelial / HPF: NONE SEEN (ref 0–5)
pH: 5 (ref 5.0–8.0)

## 2020-05-28 LAB — COMPREHENSIVE METABOLIC PANEL
ALT: 32 U/L (ref 0–44)
AST: 67 U/L — ABNORMAL HIGH (ref 15–41)
Albumin: 3.7 g/dL (ref 3.5–5.0)
Alkaline Phosphatase: 37 U/L — ABNORMAL LOW (ref 38–126)
Anion gap: 14 (ref 5–15)
BUN: 27 mg/dL — ABNORMAL HIGH (ref 8–23)
CO2: 22 mmol/L (ref 22–32)
Calcium: 8.9 mg/dL (ref 8.9–10.3)
Chloride: 103 mmol/L (ref 98–111)
Creatinine, Ser: 1.38 mg/dL — ABNORMAL HIGH (ref 0.61–1.24)
GFR, Estimated: 51 mL/min — ABNORMAL LOW (ref >60)
Glucose, Bld: 226 mg/dL — ABNORMAL HIGH (ref 70–99)
Potassium: 4.7 mmol/L (ref 3.5–5.1)
Sodium: 139 mmol/L (ref 135–145)
Total Bilirubin: 1 mg/dL (ref 0.3–1.2)
Total Protein: 7.3 g/dL (ref 6.5–8.1)

## 2020-05-28 LAB — CBC WITH DIFFERENTIAL/PLATELET
Abs Immature Granulocytes: 0.17 10*3/uL — ABNORMAL HIGH (ref 0.00–0.07)
Basophils Absolute: 0.1 10*3/uL (ref 0.0–0.1)
Basophils Relative: 0 %
Eosinophils Absolute: 0 10*3/uL (ref 0.0–0.5)
Eosinophils Relative: 0 %
HCT: 42.9 % (ref 39.0–52.0)
Hemoglobin: 14.5 g/dL (ref 13.0–17.0)
Immature Granulocytes: 1 %
Lymphocytes Relative: 5 %
Lymphs Abs: 1.1 10*3/uL (ref 0.7–4.0)
MCH: 32.2 pg (ref 26.0–34.0)
MCHC: 33.8 g/dL (ref 30.0–36.0)
MCV: 95.3 fL (ref 80.0–100.0)
Monocytes Absolute: 1.1 10*3/uL — ABNORMAL HIGH (ref 0.1–1.0)
Monocytes Relative: 5 %
Neutro Abs: 20.8 10*3/uL — ABNORMAL HIGH (ref 1.7–7.7)
Neutrophils Relative %: 89 %
Platelets: 175 10*3/uL (ref 150–400)
RBC: 4.5 MIL/uL (ref 4.22–5.81)
RDW: 13.2 % (ref 11.5–15.5)
WBC: 23.3 10*3/uL — ABNORMAL HIGH (ref 4.0–10.5)
nRBC: 0 % (ref 0.0–0.2)

## 2020-05-28 LAB — RESP PANEL BY RT-PCR (FLU A&B, COVID) ARPGX2
Influenza A by PCR: NEGATIVE
Influenza B by PCR: NEGATIVE
SARS Coronavirus 2 by RT PCR: NEGATIVE

## 2020-05-28 LAB — TROPONIN I (HIGH SENSITIVITY)
Troponin I (High Sensitivity): 41 ng/L — ABNORMAL HIGH (ref <18)
Troponin I (High Sensitivity): 52 ng/L — ABNORMAL HIGH (ref <18)

## 2020-05-28 LAB — LACTIC ACID, PLASMA
Lactic Acid, Venous: 5.4 mmol/L (ref 0.5–1.9)
Lactic Acid, Venous: 6 mmol/L (ref 0.5–1.9)

## 2020-05-28 LAB — CBG MONITORING, ED: Glucose-Capillary: 266 mg/dL — ABNORMAL HIGH (ref 70–99)

## 2020-05-28 LAB — MAGNESIUM: Magnesium: 1.6 mg/dL — ABNORMAL LOW (ref 1.7–2.4)

## 2020-05-28 LAB — HEMOGLOBIN A1C
Hgb A1c MFr Bld: 7.2 % — ABNORMAL HIGH (ref 4.8–5.6)
Mean Plasma Glucose: 159.94 mg/dL

## 2020-05-28 LAB — CK: Total CK: 1886 U/L — ABNORMAL HIGH (ref 49–397)

## 2020-05-28 LAB — TSH: TSH: 0.978 u[IU]/mL (ref 0.350–4.500)

## 2020-05-28 LAB — BRAIN NATRIURETIC PEPTIDE: B Natriuretic Peptide: 316.6 pg/mL — ABNORMAL HIGH (ref 0.0–100.0)

## 2020-05-28 MED ORDER — ACETAMINOPHEN 325 MG PO TABS
650.0000 mg | ORAL_TABLET | Freq: Four times a day (QID) | ORAL | Status: DC | PRN
  Administered 2020-05-28: 650 mg via ORAL
  Filled 2020-05-28: qty 2

## 2020-05-28 MED ORDER — VANCOMYCIN HCL 2000 MG/400ML IV SOLN
2000.0000 mg | Freq: Once | INTRAVENOUS | Status: AC
  Administered 2020-05-28: 2000 mg via INTRAVENOUS
  Filled 2020-05-28: qty 400

## 2020-05-28 MED ORDER — INSULIN ASPART 100 UNIT/ML IJ SOLN
0.0000 [IU] | Freq: Three times a day (TID) | INTRAMUSCULAR | Status: DC
  Administered 2020-05-29: 2 [IU] via SUBCUTANEOUS
  Administered 2020-05-29: 1 [IU] via SUBCUTANEOUS
  Administered 2020-05-29 – 2020-05-30 (×3): 2 [IU] via SUBCUTANEOUS
  Filled 2020-05-28 (×3): qty 1

## 2020-05-28 MED ORDER — LACTATED RINGERS IV SOLN: INTRAVENOUS | Status: DC

## 2020-05-28 MED ORDER — FUROSEMIDE 10 MG/ML IJ SOLN
40.0000 mg | Freq: Once | INTRAMUSCULAR | Status: AC
  Administered 2020-05-28: 40 mg via INTRAVENOUS
  Filled 2020-05-28 (×2): qty 4

## 2020-05-28 MED ORDER — HYDROCODONE-ACETAMINOPHEN 5-325 MG PO TABS
1.0000 | ORAL_TABLET | Freq: Four times a day (QID) | ORAL | Status: DC | PRN
  Administered 2020-05-29 (×2): 1 via ORAL
  Filled 2020-05-28 (×2): qty 1

## 2020-05-28 MED ORDER — VANCOMYCIN HCL 1250 MG/250ML IV SOLN
1250.0000 mg | INTRAVENOUS | Status: DC
  Filled 2020-05-28: qty 250

## 2020-05-28 MED ORDER — ENOXAPARIN SODIUM 40 MG/0.4ML IJ SOSY: 40.0000 mg | PREFILLED_SYRINGE | INTRAMUSCULAR | Status: DC

## 2020-05-28 MED ORDER — ACETAMINOPHEN 650 MG RE SUPP
650.0000 mg | Freq: Four times a day (QID) | RECTAL | Status: DC | PRN
Start: 2020-05-28 — End: 2020-05-30

## 2020-05-28 MED ORDER — SODIUM CHLORIDE 0.9 % IV SOLN
2.0000 g | INTRAVENOUS | Status: DC
  Administered 2020-05-29: 2 g via INTRAVENOUS
  Filled 2020-05-28 (×2): qty 20

## 2020-05-28 MED ORDER — MAGNESIUM SULFATE 2 GM/50ML IV SOLN
2.0000 g | Freq: Once | INTRAVENOUS | Status: AC
  Administered 2020-05-29: 2 g via INTRAVENOUS
  Filled 2020-05-28 (×2): qty 50

## 2020-05-28 MED ORDER — LACTATED RINGERS IV BOLUS
2000.0000 mL | Freq: Once | INTRAVENOUS | Status: AC
  Administered 2020-05-28: 2000 mL via INTRAVENOUS

## 2020-05-28 MED ORDER — METOPROLOL SUCCINATE ER 50 MG PO TB24
50.0000 mg | ORAL_TABLET | Freq: Every day | ORAL | Status: DC
  Administered 2020-05-29 – 2020-05-30 (×2): 50 mg via ORAL
  Filled 2020-05-28 (×2): qty 1

## 2020-05-28 MED ORDER — ASPIRIN 81 MG PO CHEW
325.0000 mg | CHEWABLE_TABLET | Freq: Once | ORAL | Status: AC
  Administered 2020-05-29: 325 mg via ORAL
  Filled 2020-05-28: qty 5

## 2020-05-28 MED ORDER — MORPHINE SULFATE (PF) 4 MG/ML IV SOLN
4.0000 mg | Freq: Once | INTRAVENOUS | Status: AC
  Administered 2020-05-28: 4 mg via INTRAVENOUS
  Filled 2020-05-28: qty 1

## 2020-05-28 MED ORDER — VANCOMYCIN HCL 500 MG/100ML IV SOLN
500.0000 mg | Freq: Once | INTRAVENOUS | Status: AC
  Administered 2020-05-29: 500 mg via INTRAVENOUS
  Filled 2020-05-28: qty 100

## 2020-05-28 MED ORDER — SIMVASTATIN 20 MG PO TABS: 20.0000 mg | ORAL_TABLET | Freq: Every day | ORAL | Status: DC

## 2020-05-28 MED ORDER — SODIUM CHLORIDE 0.9 % IV SOLN
2.0000 g | Freq: Once | INTRAVENOUS | Status: AC
  Administered 2020-05-28: 2 g via INTRAVENOUS
  Filled 2020-05-28: qty 20

## 2020-05-28 NOTE — ED Notes (Signed)
Lab at bedside

## 2020-05-28 NOTE — Consult Note (Signed)
Pharmacy Antibiotic Note  Jordan Drake is a 80 y.o. male admitted on 05/28/2020 with cellulitis. Labs notable for WBC 23.3, lactic acid 5.4, Scr 1.38 (unclear baseline). Patient is afebrile, other vitals within normal limits. Pharmacy has been consulted for vancomcyin dosing. Patient is also ordered ceftriaxone.   Plan:  Vancomycin 2500 mg IV LD x 1 followed by maintenance regimen of vancomycin 1250 mg IV q24h --Calculated AUC: 505, Cmin 13.4 --Daily Scr per protocol --Levels at steady state as clinically indicated  Height: 5\' 9"  (175.3 cm) Weight: 126.1 kg (278 lb) IBW/kg (Calculated) : 70.7  Temp (24hrs), Avg:98 F (36.7 C), Min:98 F (36.7 C), Max:98 F (36.7 C)  Recent Labs  Lab 05/28/20 1721 05/28/20 1722  WBC 23.3*  --   CREATININE 1.38*  --   LATICACIDVEN  --  5.4*    Estimated Creatinine Clearance: 55.2 mL/min (A) (by C-G formula based on SCr of 1.38 mg/dL (H)).    No Known Allergies  Antimicrobials this admission: Ceftriaxone 5/16 >>  Vancomycin 5/16 >>   Dose adjustments this admission: N/A  Microbiology results: 5/16 BCx: pending  Thank you for allowing pharmacy to be a part of this patient's care.  Benita Gutter 05/28/2020 8:30 PM

## 2020-05-28 NOTE — Progress Notes (Signed)
Brief note regarding plan, with full H&P to follow:  80 year old male with history of coronary artery disease status post CABG, hypertension, type 2 diabetes mellitus, and a reported history of chronic heart failure (no echo result available), who is admitted for SIRS sepsis due to left lower extremity cellulitis after presenting with 3 to 4 days of progressive left lower extremity erythema, swelling, tenderness, and increased warmth to the touch, with associated subjective fever and chills.  Plain films of the left lower extremity show evidence of subcutaneous edema consistent with cellulitis in the absence of any subcutaneous gas, with no crepitus on physical exam.  Venous ultrasound of the left lower extremity negative for DVT.  He also notes that he has been experiencing approximately 1 week of mild shortness of breath associated with orthopnea, and notes a 20 pound weight gain over the last 6 months, without any recent acceleration of this rate of gain.  Not associated with any chest pain.  On IV vancomycin and Rocephin.  Trending elevated lactate, while administering gentle IV fluids in the setting of evidence of subacute decompensated heart failure.  His elevated lactate appears to be multifactorial, with some contribution from sepsis as well as potential contributions from acute kidney injury and type B process from home metformin use.  Echocardiogram ordered for the morning.    Per pharmacy consult, the patient was previously on an outpatient diuretic, although this appears to have been stopped over the last few months.  Remainder of this consult pending for details regarding nature and dose of his prior diuretic regimen.     Babs Bertin, DO Hospitalist

## 2020-05-28 NOTE — ED Notes (Signed)
Spoke with charge regarding nurse not being assigned

## 2020-05-28 NOTE — ED Provider Notes (Signed)
[facility] Emergency Department Provider Note ____________________________________________   Event Date/Time   First MD Initiated Contact with Patient 05/28/20 1642     (approximate)  I have reviewed the triage vital signs and the nursing notes.  HISTORY  Chief Complaint Fall (/)   HPI Jordan Drake is a 80 y.o. malewho presents to the ED for evaluation of a fall.  Chart review indicates history of morbid obesity, DM on oral agents, CAD, HTN and HLD. Patient resides at home with his wife and typically ambulates with 2 canes.   Patient presents with his son, who provides some additional history, for evaluation of left foot and ankle pain after a fall.  Patient admits to feeling more short of breath and weak over the past 24 hours.  He was ambulating with his 2 canes to the bathroom to void when he reports one cane slipping out of his hand, causing him to trip and fall forward.  Reports twisting motion and pain to his left foot and ankle.  Reports inability to get up, and requiring EMS for lift assist and transfer to ED for evaluation.  Past Medical History:  Diagnosis Date  . Coronary artery disease   . Diabetes mellitus without complication ([place])   . Hypertension     Patient Active Problem List   Diagnosis Date Noted  . Cellulitis of left lower extremity 05/28/2020  . Diabetic neuropathy ([place]) 08/25/2018    Past Surgical History:  Procedure Laterality Date  . cardiac stents      Prior to Admission medications   Medication Sig Start Date End Date Taking? Authorizing Provider  amLODipine-benazepril (LOTREL) 10-20 MG capsule Take 1 capsule by mouth daily. 07/19/18   [provider]  HYDROcodone-acetaminophen (NORCO/VICODIN) 5-325 MG tablet Take 1 tablet by mouth every 6 (six) hours as needed. 05/17/18   [provider]  JARDIANCE 10 MG TABS tablet Take 10 mg by mouth daily. 04/09/18   [provider]  metFORMIN (GLUCOPHAGE)  500 MG tablet Take 500 mg by mouth 2 (two) times daily with a meal. 08/19/18   [provider]  metoprolol succinate (TOPROL-XL) 100 MG 24 hr tablet Take 100 mg by mouth daily. 08/19/18   [provider]  nitroGLYCERIN (NITROSTAT) 0.4 MG SL tablet DISSOLVE ONE TABLET UNDER THE TONGUE EVERY 5 MINUTES AS NEEDED FOR CHEST PAIN. DO NOT EXCEED A TOTAL OF 3 DOSES IN 15 MINUTES...CALL 911 08/09/18   [provider]  simvastatin (ZOCOR) 20 MG tablet Take 20 mg by mouth daily. 07/19/18   [provider]  sulfamethoxazole-trimethoprim (BACTRIM DS) 800-160 MG tablet Take 1 tablet by mouth 2 (two) times daily. for 10 days 05/17/18   [provider]    Allergies Patient has no known allergies.  History reviewed. No pertinent family history.  Social History Social History   Tobacco Use  . Smoking status: Former Research scientist (life sciences)  . Smokeless tobacco: Former Network engineer Use Topics  . Alcohol use: Not Currently  . Drug use: Never    Review of Systems  Constitutional:  Positive for generalized weakness and chills Eyes: No visual changes. ENT: No sore throat. Cardiovascular: Denies chest pain. Respiratory: Positive for shortness of breath. Gastrointestinal: No abdominal pain.  No nausea, no vomiting.  No diarrhea.  No constipation. Genitourinary: Negative for dysuria. Musculoskeletal: Positive for left lower leg pain. Skin: Negative for rash. Neurological: Negative for headaches, focal weakness or numbness.  ____________________________________________   PHYSICAL EXAM:  VITAL SIGNS:  Vitals:   05/28/20 1632  BP: (!) 129/97  Pulse: 77  Resp: 18  Temp: 98 F (36.7 C)  SpO2: 96%     Constitutional: Alert and oriented.  Morbidly obese and noticeably tachypneic.  Requires 2 person assist to sit up in bed to look at his back. Eyes: Conjunctivae are normal. PERRL. EOMI. Head: Atraumatic. Nose: No congestion/rhinnorhea. Mouth/Throat: Mucous membranes are  moist.  Oropharynx non-erythematous. Neck: No stridor. No cervical spine tenderness to palpation. Cardiovascular: Normal rate, regular rhythm. Grossly normal heart sounds.  Good peripheral circulation. Respiratory: Tachypneic with clear lungs.. Gastrointestinal: Soft , nondistended, nontender to palpation. No CVA tenderness.  Benign abdomen. Musculoskeletal: Pitting edema to bilateral lower extremities, significantly worse than the left.  Left leg is diffusely erythematous and tender beneath the knee, and appears like cellulitis..  Most tender to left foot and ankle without evidence of laceration or open injury. Seen the patient for to examine his back, demonstrates confluent erythema and warmth to touch throughout his back.  No focal features and no spinal tenderness. Neurologic:  Normal speech and language. No gross focal neurologic deficits are appreciated.  Skin:  Skin is warm, dry and intact.  Flat erythematous rash to the left lower leg Psychiatric: Mood and affect are normal. Speech and behavior are normal.  ____________________________________________   LABS (all labs ordered are listed, but only abnormal results are displayed)  Labs Reviewed  COMPREHENSIVE METABOLIC PANEL - Abnormal; Notable for the following components:      Result Value   Glucose, Bld 226 (*)    BUN 27 (*)    Creatinine, Ser 1.38 (*)    AST 67 (*)    Alkaline Phosphatase 37 (*)    GFR, Estimated 51 (*)    All other components within normal limits  CBC WITH DIFFERENTIAL/PLATELET - Abnormal; Notable for the following components:   WBC 23.3 (*)    Neutro Abs 20.8 (*)    Monocytes Absolute 1.1 (*)    Abs Immature Granulocytes 0.17 (*)    All other components within normal limits  LACTIC ACID, PLASMA - Abnormal; Notable for the following components:   Lactic Acid, Venous 5.4 (*)    All other components within normal limits  BRAIN NATRIURETIC PEPTIDE - Abnormal; Notable for the following components:   B  Natriuretic Peptide 316.6 (*)    All other components within normal limits  MAGNESIUM - Abnormal; Notable for the following components:   Magnesium 1.6 (*)    All other components within normal limits  TROPONIN I (HIGH SENSITIVITY) - Abnormal; Notable for the following components:   Troponin I (High Sensitivity) 41 (*)    All other components within normal limits  RESP PANEL BY RT-PCR (FLU A&B, COVID) ARPGX2  CULTURE, BLOOD (ROUTINE X 2)  CULTURE, BLOOD (ROUTINE X 2)  URINALYSIS, COMPLETE (UACMP) WITH MICROSCOPIC  LACTIC ACID, PLASMA  PHOSPHORUS  MAGNESIUM  COMPREHENSIVE METABOLIC PANEL  CBC WITH DIFFERENTIAL/PLATELET  HEMOGLOBIN A1C  TROPONIN I (HIGH SENSITIVITY)   ____________________________________________  12 Lead EKG   ____________________________________________  RADIOLOGY  ED MD interpretation: Plain films demonstrate no fracture or dislocation. CXR reviewed by me with pulmonary vascular congestion and cardiomegaly  Official radiology report(s): DG Ankle 2 Views Left  Result Date: 05/28/2020 CLINICAL DATA:  Status post fall. EXAM: LEFT ANKLE - 2 VIEW COMPARISON:  None. FINDINGS: There is no evidence of an acute fracture, dislocation, or joint effusion. Small chronic changes are seen involving the left lateral malleolus and anterior aspect  of the distal right tibia. There is a small to moderate sized plantar calcaneal spur. Diffuse soft tissue swelling is seen which is likely secondary to the patient's body habitus. Diffuse distortion of the subcutaneous fat is seen. Marked severity vascular calcification is noted. IMPRESSION: 1. Small chronic changes without an acute osseous abnormality. 2. Additional findings consistent with diffuse cellulitis. Electronically Signed   By: Virgina Norfolk M.D.   On: 05/28/2020 17:57   US Venous Img Lower Unilateral Left  Result Date: 05/28/2020 CLINICAL DATA:  80 year old with left lower extremity swelling. EXAM: LEFT LOWER EXTREMITY  VENOUS DOPPLER ULTRASOUND TECHNIQUE: Gray-scale sonography with graded compression, as well as color Doppler and duplex ultrasound were performed to evaluate the lower extremity deep venous systems from the level of the common femoral vein and including the common femoral, femoral, profunda femoral, popliteal and calf veins including the posterior tibial, peroneal and gastrocnemius veins when visible. The superficial great saphenous vein was also interrogated. Spectral Doppler was utilized to evaluate flow at rest and with distal augmentation maneuvers in the common femoral, femoral and popliteal veins. COMPARISON:  None. FINDINGS: Contralateral Common Femoral Vein: Respiratory phasicity is normal and symmetric with the symptomatic side. No evidence of thrombus. Normal compressibility. Common Femoral Vein: No evidence of thrombus. Normal compressibility, respiratory phasicity and response to augmentation. Saphenofemoral Junction: No evidence of thrombus. Normal compressibility and flow on color Doppler imaging. Profunda Femoral Vein: No evidence of thrombus. Normal compressibility and flow on color Doppler imaging. Femoral Vein: No evidence of thrombus. Normal compressibility, respiratory phasicity and response to augmentation. Popliteal Vein: No evidence of thrombus. Normal compressibility, respiratory phasicity and response to augmentation. Calf Veins: Visualized left deep calf veins are patent without thrombus. Limited evaluation. Other Findings:  None. IMPRESSION: Negative for deep venous thrombosis in left lower extremity. Limited evaluation of the left deep calf veins. Electronically Signed   By: Markus Daft M.D.   On: 05/28/2020 18:20   DG Chest Portable 1 View  Result Date: 05/28/2020 CLINICAL DATA:  Status post fall. EXAM: PORTABLE CHEST 1 VIEW COMPARISON:  None. FINDINGS: Multiple sternal wires and vascular clips are noted. Mild to moderate severity diffusely increased interstitial lung markings are  seen. There is no evidence of focal consolidation, pleural effusion or pneumothorax. The cardiac silhouette is mildly enlarged. The visualized skeletal structures are unremarkable. IMPRESSION: 1. Evidence of prior median sternotomy/CABG. 2. Cardiomegaly with increased interstitial lung markings which are likely, in part, chronic in nature. A superimposed component of interstitial edema cannot be excluded. Electronically Signed   By: Virgina Norfolk M.D.   On: 05/28/2020 17:51   DG Foot 2 Views Left  Result Date: 05/28/2020 CLINICAL DATA:  Status post fall. EXAM: LEFT FOOT - 2 VIEW COMPARISON:  None. FINDINGS: A fracture deformity of indeterminate age is seen involving the proximal phalanx of the fifth right toe. There is no evidence of dislocation. Mild chronic changes are seen along the anterior aspect of the distal right tibia. Marked severity diffuse soft tissue swelling is seen with diffuse distortion of the subcutaneous fat throughout the right foot and right ankle. IMPRESSION: 1. Fracture of the proximal phalanx of the fifth right toe of indeterminate age. 2. Additional findings consistent with diffuse cellulitis. Electronically Signed   By: Virgina Norfolk M.D.   On: 05/28/2020 18:12    ____________________________________________   PROCEDURES and INTERVENTIONS  Procedure(s) performed (including Critical Care):  .Critical Care Performed by: Vladimir Crofts, MD Authorized by: Vladimir Crofts, MD   Critical  care provider statement:    Critical care time (minutes):  45   Critical care was necessary to treat or prevent imminent or life-threatening deterioration of the following conditions:  Sepsis   Critical care was time spent personally by me on the following activities:  Discussions with consultants, evaluation of patient's response to treatment, examination of patient, ordering and performing treatments and interventions, ordering and review of laboratory studies, ordering and review of  radiographic studies, pulse oximetry, re-evaluation of patient's condition, obtaining history from patient or surrogate and review of old charts    Medications  lactated ringers infusion (has no administration in time range)  acetaminophen (TYLENOL) tablet 650 mg (has no administration in time range)    Or  acetaminophen (TYLENOL) suppository 650 mg (has no administration in time range)  enoxaparin (LOVENOX) injection 40 mg (has no administration in time range)  insulin aspart (novoLOG) injection 0-9 Units (has no administration in time range)  HYDROcodone-acetaminophen (NORCO/VICODIN) 5-325 MG per tablet 1 tablet (has no administration in time range)  metoprolol succinate (TOPROL-XL) 24 hr tablet 50 mg (has no administration in time range)  simvastatin (ZOCOR) tablet 20 mg (has no administration in time range)  morphine 4 MG/ML injection 4 mg (4 mg Intravenous Given 05/28/20 1727)  cefTRIAXone (ROCEPHIN) 2 g in sodium chloride 0.9 % 100 mL IVPB (0 g Intravenous Stopped 05/28/20 1923)  furosemide (LASIX) injection 40 mg (40 mg Intravenous Given 05/28/20 1848)  lactated ringers bolus 2,000 mL (2,000 mLs Intravenous New Bag/Given 05/28/20 1845)    ____________________________________________   MDM / ED COURSE   80 year old male presents to the ED for evaluation after mechanical fall, without evidence of traumatic pathology, but with evidence of severe cellulitis of his left lower extremity requiring medical admission and IV antibiotics.  Quite tachypneic, but not hypoxic and remains hemodynamically stable throughout his stay in the ED.  Exam demonstrates a morbidly obese patient who is tachypneic and appears uncomfortable.  Cellulitis throughout the distal left lower extremity without evidence of compartment syndrome, abscess or necrotizing fasciitis.  Blood work with leukocytosis and lactic acidosis, for which he received IV fluids and Rocephin.  Due to his concurrent shortness of breath, CXR  demonstrates pulm vascular congestion, and his BNP is up.  I suspect a degree of CHF exacerbation as well, due to fluid resuscitation necessitated by his infectious pathology, will also provide small dose of IV Lasix to hopefully prevent volume overload.  We will admit to hospitalist medicine for further work-up and management   Clinical Course as of 05/28/20 1950  Mon May 28, 2020  1821 Call from lab about his lactic acidosis.  I urged the nurse to provide fluids, Lasix and antibiotics soon as possible. [DS]    Clinical Course User Index [DS] Vladimir Crofts, MD    ____________________________________________   FINAL CLINICAL IMPRESSION(S) / ED DIAGNOSES  Final diagnoses:  Cellulitis of left lower extremity  Fall, initial encounter     ED Discharge Orders    None        Tamala Julian   Note:  This document was prepared using Dragon voice recognition software and may include unintentional dictation errors.   Vladimir Crofts, MD 05/28/20 [phone]

## 2020-05-28 NOTE — ED Notes (Signed)
Pt transported via transporter

## 2020-05-28 NOTE — ED Triage Notes (Addendum)
Presents via EMS from home s/p fall    States his knees gave out  Having pain to both ankles   Pain is worse to the left

## 2020-05-28 NOTE — H&P (Signed)
History and Physical    PLEASE NOTE THAT [employer]AGON [employer]CT[employer] SOFTWARE WAS USED IN THE CONSTRUCTION OF THIS NOTE.   Jordan Drake [id number] DOB: 31-Oct-1940 DOA: 05/28/2020  PCP: Prince Solian, MD Patient coming from: home   I have personally briefly reviewed patient's old medical records in [place]  Chief Complaint: Left lower extremity erythema  HPI: Jordan Drake is a 80 y.o. male with medical history significant for chronic heart failure with preserved EF, hypertension, type 2 diabetes mellitus, coronary artery disease status post CABG,  Who is admitted to [facility] on 05/28/2020 with severe sepsis due to left lower extremity cellulitis after presenting from home to [facility] ED complaining of left lower extremity erythema.   The patient reports 3 to 4 days of progressive left lower extremity erythema over the anterior aspect of the left lower extremity distal to the left knee and extending over the dorsal aspect of the left foot.  He notes associated increased swelling, increased warmth to touch, tenderness, but notes no associated drainage.  He also reports associated subjective fever and chills over that timeframe.  Leading up to initial onset of the left lower extremity erythema, the patient denies any recent trauma to the left lower extremity including no known breakdown in associated dermal integrity.  Denies any associated numbness or paresthesias.   While ambulating at home with his baseline 2 canes earlier today, the patient reports that his cane became caught on an object on the floor, causing him to drop back again at which time he fell on his left side with a slight twisting motion involving the left lower extremity.  He noted pain associated with the left lower extremity, but was unable to determine if this it was an exacerbation of the progressive discomfort he has been associate with the left lower extremity over the last 3 to 4 days in the setting  of progressive erythema, increased warmth, and tenderness.  He did not hit his head as a component of this fall, and denies any associated loss of consciousness.  Denies any acute focal weakness.  In the setting of 3 to 4 days of generalized weakness patient reports that he was unable initially to extricate himself from the floor after this fall.  Consequently, he remained on the floor for approximately an hour before being able to reach a phone to call for help.   Per review of systems, the patient acknowledges 7 to 10 days of mild, but progressive shortness of breath associated with new onset orthopnea and PND.  He denies any associated chest pain, diaphoresis, palpitations, nausea, vomiting, presyncope, or syncope.  He notes associated mild nonproductive cough, but denies any wheezing hemoptysis.  No recent travel or known COVID-19 exposures.  He denies any recent melena or hematochezia.  He reports weight gain of approximately 20 pounds over the last 6 months, and denies any recent acceleration of his rate of weight gain.  He reports good compliance with his home diuretic regimen, which he states is monotherapy, but he is unable to remember the specific medication that he is on for this purpose.  Denies any recent changes to his home diuretic regimen, and confirms a history of chronic heart failure with preserved EF in the setting of history of CAD status post CABG.  Per chart review, I have been unable to locate any prior echocardiogram results.  The patient emphasized to me that he is shortness of breath is not his reason for presentation to  the ED today as opposed to being prompted by his progressive left lower extremity erythema associated with worsening tenderness, increased warmth, as detailed above.   Otherwise, the patient denies any recent headache, neck stiffness, rhinitis, drainage, sore throat, abdominal pain, diarrhea.  Denies any recent dysuria, gross hematuria, or change in urinary  urgency/frequency.     ED Course:  Vital signs in the ED were notable for the following: Temperature 98.0, heart rate 77-90, blood pressure 129/97, respiratory rate 18-28, oxygen saturation 96% on room air.  Labs were notable for the following: CMP was notable for the following: Sodium 139, BUN 27, creatinine 1.3 with no prior creatinine data points available in our EMR, including with review of Care Everywhere, for point comparison, glucose 226, and liver enzymes were found to be within normal limits.  CBC notable for white blood cell count of 23,000 with 89% neutrophils.  Initial lactate 5.4.  CPK 1800.  Urinalysis has been ordered, with result currently pending.  BNP 316, with no prior value available for point comparison.  High-sensitivity troponin I 41.  Nasopharyngeal COVID-19 PCR/influenza PCR performed in the emergency department this evening, and found to be negative.  EKG showed sinus rhythm with first-degree AV block, heart rate 83, PR 346, nonspecific T wave inversion in aVL, no evidence of ST changes, including no evidence of ST elevation.  Chest x-ray showed cardiomegaly with increase in interstitial markings concerning for interstitial edema in the absence of evidence of infiltrate, pleural effusion, or pneumothorax.  Plain films of the left ankle were notable for no evidence of acute osseous abnormality, but showed evidence of subcutaneous edema consistent with cellulitis in the absence of any evidence of subcutaneous gas.  Venous ultrasound of the left lower extremity showed no evidence of DVT.  While in the ED, the following were administered: Lasix 40 mg IV x1, morphine 4 mg IV x1, Rocephin 2 g IV x1, and a 2 L LR bolus.  Socially, the patient was admitted for further evaluation management of severe sepsis due to left lower extremity cellulitis, with management complicated by evidence of subacute exacerbation of chronic heart failure with preserved EF as well as potential  rhabdomyolysis.      Review of Systems: As per HPI otherwise 10 point review of systems negative.   Past Medical History:  Diagnosis Date  . Coronary artery disease   . Diabetes mellitus without complication ([place])   . Hypertension     Past Surgical History:  Procedure Laterality Date  . cardiac stents      Social History:  reports that he has quit smoking. He has quit using smokeless tobacco. He reports previous alcohol use. He reports that he does not use drugs.   No Known Allergies  History reviewed. No pertinent family history.    Prior to Admission medications   Medication Sig Start Date End Date Taking? Authorizing Provider  amLODipine-benazepril (LOTREL) 10-20 MG capsule Take 1 capsule by mouth daily. 07/19/18   [provider]  HYDROcodone-acetaminophen (NORCO/VICODIN) 5-325 MG tablet Take 1 tablet by mouth every 6 (six) hours as needed. 05/17/18   [provider]  JARDIANCE 10 MG TABS tablet Take 10 mg by mouth daily. 04/09/18   [provider]  metFORMIN (GLUCOPHAGE) 500 MG tablet Take 500 mg by mouth 2 (two) times daily with a meal. 08/19/18   [provider]  metoprolol succinate (TOPROL-XL) 100 MG 24 hr tablet Take 100 mg by mouth daily. 08/19/18   [provider]  nitroGLYCERIN (NITROSTAT) 0.4 MG SL tablet DISSOLVE ONE TABLET UNDER THE TONGUE EVERY 5 MINUTES AS NEEDED FOR CHEST PAIN. DO NOT EXCEED A TOTAL OF 3 DOSES IN 15 MINUTES...CALL 911 08/09/18   [provider]  simvastatin (ZOCOR) 20 MG tablet Take 20 mg by mouth daily. 07/19/18   [provider]  sulfamethoxazole-trimethoprim (BACTRIM DS) 800-160 MG tablet Take 1 tablet by mouth 2 (two) times daily. for 10 days 05/17/18   [provider]     Objective    Physical Exam: Vitals:   05/28/20 1632 05/28/20 1634 05/28/20 1635  BP: (!) 129/97    Pulse: 77    Resp: 18    Temp: 98 F (36.7 C)    TempSrc: Oral    SpO2: 96%    Weight:   126.1 kg   Height:  5\' 9"  (1.753 m) 5\' 9"  (1.753 m)    General: appears to be stated age; alert, oriented Skin: warm, dry; erythema of the anterior aspect of the left lower extremity distal to the left knee and extending onto the dorsal aspect of the left foot associated with increased warmth, swelling, and tenderness in the absence of any associated drainage. Head:  AT/[place] Mouth:  Oral mucosa membranes appear dry, normal dentition Neck: supple; trachea midline Heart:  RRR; did not appreciate any M/R/G Lungs: CTAB, did not appreciate any wheezes, rales, or rhonchi Abdomen: + BS; soft, ND, NT Vascular: 2+ pedal pulses b/l; 2+ radial pulses b/l Extremities: Erythema, increased tenderness increased warmth associated with left lower extremity distal to the left knee, as further described above; no muscle wasting Neuro: strength and sensation intact in upper and lower extremities b/l     Labs on Admission: I have personally reviewed following labs and imaging studies  CBC: Recent Labs  Lab 05/28/20 1721  WBC 23.3*  NEUTROABS 20.8*  HGB 14.5  HCT 42.9  MCV 95.3  PLT 0000000   Basic Metabolic Panel: Recent Labs  Lab 05/28/20 1721  NA 139  K 4.7  CL 103  CO2 22  GLUCOSE 226*  BUN 27*  CREATININE 1.38*  CALCIUM 8.9   GFR: Estimated Creatinine Clearance: 55.2 mL/min (A) (by C-G formula based on SCr of 1.38 mg/dL (H)). Liver Function Tests: Recent Labs  Lab 05/28/20 1721  AST 67*  ALT 32  ALKPHOS 37*  BILITOT 1.0  PROT 7.3  ALBUMIN 3.7   No results for input(s): LIPASE, AMYLASE in the last 168 hours. No results for input(s): AMMONIA in the last 168 hours. Coagulation Profile: No results for input(s): INR, PROTIME in the last 168 hours. Cardiac Enzymes: No results for input(s): CKTOTAL, CKMB, CKMBINDEX, TROPONINI in the last 168 hours. BNP (last 3 results) No results for input(s): PROBNP in the last 8760 hours. HbA1C: No results for input(s): HGBA1C in the last 72  hours. CBG: No results for input(s): GLUCAP in the last 168 hours. Lipid Profile: No results for input(s): CHOL, HDL, LDLCALC, TRIG, CHOLHDL, LDLDIRECT in the last 72 hours. Thyroid Function Tests: No results for input(s): TSH, T4TOTAL, FREET4, T3FREE, THYROIDAB in the last 72 hours. Anemia Panel: No results for input(s): VITAMINB12, FOLATE, FERRITIN, TIBC, IRON, RETICCTPCT in the last 72 hours. Urine analysis: No results found for: COLORURINE, APPEARANCEUR, [place], [place], GLUCOSEU, HGBUR, BILIRUBINUR, KETONESUR, PROTEINUR, UROBILINOGEN, NITRITE, LEUKOCYTESUR  Radiological Exams on Admission: DG Ankle 2 Views Left  Result Date: 05/28/2020 CLINICAL DATA:  Status post fall. EXAM: LEFT ANKLE - 2 VIEW COMPARISON:  None. FINDINGS: There is  no evidence of an acute fracture, dislocation, or joint effusion. Small chronic changes are seen involving the left lateral malleolus and anterior aspect of the distal right tibia. There is a small to moderate sized plantar calcaneal spur. Diffuse soft tissue swelling is seen which is likely secondary to the patient's body habitus. Diffuse distortion of the subcutaneous fat is seen. Marked severity vascular calcification is noted. IMPRESSION: 1. Small chronic changes without an acute osseous abnormality. 2. Additional findings consistent with diffuse cellulitis. Electronically Signed   By: Virgina Norfolk M.D.   On: 05/28/2020 17:57   US Venous Img Lower Unilateral Left  Result Date: 05/28/2020 CLINICAL DATA:  80 year old with left lower extremity swelling. EXAM: LEFT LOWER EXTREMITY VENOUS DOPPLER ULTRASOUND TECHNIQUE: Gray-scale sonography with graded compression, as well as color Doppler and duplex ultrasound were performed to evaluate the lower extremity deep venous systems from the level of the common femoral vein and including the common femoral, femoral, profunda femoral, popliteal and calf veins including the posterior tibial, peroneal and gastrocnemius  veins when visible. The superficial great saphenous vein was also interrogated. Spectral Doppler was utilized to evaluate flow at rest and with distal augmentation maneuvers in the common femoral, femoral and popliteal veins. COMPARISON:  None. FINDINGS: Contralateral Common Femoral Vein: Respiratory phasicity is normal and symmetric with the symptomatic side. No evidence of thrombus. Normal compressibility. Common Femoral Vein: No evidence of thrombus. Normal compressibility, respiratory phasicity and response to augmentation. Saphenofemoral Junction: No evidence of thrombus. Normal compressibility and flow on color Doppler imaging. Profunda Femoral Vein: No evidence of thrombus. Normal compressibility and flow on color Doppler imaging. Femoral Vein: No evidence of thrombus. Normal compressibility, respiratory phasicity and response to augmentation. Popliteal Vein: No evidence of thrombus. Normal compressibility, respiratory phasicity and response to augmentation. Calf Veins: Visualized left deep calf veins are patent without thrombus. Limited evaluation. Other Findings:  None. IMPRESSION: Negative for deep venous thrombosis in left lower extremity. Limited evaluation of the left deep calf veins. Electronically Signed   By: Markus Daft M.D.   On: 05/28/2020 18:20   DG Chest Portable 1 View  Result Date: 05/28/2020 CLINICAL DATA:  Status post fall. EXAM: PORTABLE CHEST 1 VIEW COMPARISON:  None. FINDINGS: Multiple sternal wires and vascular clips are noted. Mild to moderate severity diffusely increased interstitial lung markings are seen. There is no evidence of focal consolidation, pleural effusion or pneumothorax. The cardiac silhouette is mildly enlarged. The visualized skeletal structures are unremarkable. IMPRESSION: 1. Evidence of prior median sternotomy/CABG. 2. Cardiomegaly with increased interstitial lung markings which are likely, in part, chronic in nature. A superimposed component of interstitial edema  cannot be excluded. Electronically Signed   By: Virgina Norfolk M.D.   On: 05/28/2020 17:51   DG Foot 2 Views Left  Result Date: 05/28/2020 CLINICAL DATA:  Status post fall. EXAM: LEFT FOOT - 2 VIEW COMPARISON:  None. FINDINGS: A fracture deformity of indeterminate age is seen involving the proximal phalanx of the fifth right toe. There is no evidence of dislocation. Mild chronic changes are seen along the anterior aspect of the distal right tibia. Marked severity diffuse soft tissue swelling is seen with diffuse distortion of the subcutaneous fat throughout the right foot and right ankle. IMPRESSION: 1. Fracture of the proximal phalanx of the fifth right toe of indeterminate age. 2. Additional findings consistent with diffuse cellulitis. Electronically Signed   By: Virgina Norfolk M.D.   On: 05/28/2020 18:12     EKG: Independently reviewed, with  result as described above.    Assessment/Plan   Jordan Drake is a 80 y.o. male with medical history significant for chronic heart failure with preserved EF, hypertension, type 2 diabetes mellitus, coronary artery disease status post CABG,  Who is admitted to [facility] on 05/28/2020 with severe sepsis due to left lower extremity cellulitis after presenting from home to [facility] ED complaining of left lower extremity erythema.    Principal Problem:   Cellulitis of left lower extremity Active Problems:   Severe sepsis (HCC)   Acute on chronic diastolic CHF (congestive heart failure) (HCC)   Elevated troponin   Generalized weakness   Lactic acidosis   Elevated CPK   Essential hypertension   Diabetes mellitus without complication (HCC)     #) Severe sepsis due to left lower extremity cellulitis: Diagnosis on the basis of 3 to 4 days of progressive left lower extremity erythema associated with increased warmth, increased tenderness, swelling, but no drainage, including no evidence of purulence, with plain films of the left  lower extremity showing evidence of subcutaneous edema consistent with cellulitis.  Of note, plain films of the left lower extremity did not show any evidence of subcutaneous gas, and physical exam revealed no evidence of associated crepitus, thereby decreasing likelihood for underlying necrotizing fasciitis.  In terms of risk factors for development of cellulitis of the left lower extremity, suspect a degree of venous stasis as a contributing risk factor, but no evidence of portal of entry identified on physical exam.  Of note, left lower extremity was found to be neurovascular intact.  Surgery to admit.  Presenting leukocytosis and tachypnea, with criteria for patient's sepsis to be considered severe nature on the basis of concomitant evidence of endorgan damage in the form of elevated lactic acid of 5.4.  In the setting of the patient's ideal body weight of 70 kg, he has received a 2 L LR bolus, consistent with 30 mL/kg IVF volume.  We will refrain from additional IV fluid boluses at this time given hemodynamic stability in the context of concomitant evidence for subacute exacerbation of heart failure.  Unfortunately, given degree of current lactic acidosis as well as elevation in CPK, additional IV fluids will need to be administered as a priority over presence of what appears to be subacute heart failure exacerbation given the patient's report of gradual worsening of shortness of breath over the last 10 days with suggestion of interstitial edema on chest x-ray.  We will be slightly less aggressive with IV fluids and otherwise in the setting of patient's elevated CPK level, with the intent of avoiding volume overload given concomitant exacerbation of heart failure.   In the setting of severe sepsis, patient's cellulitis meets criteria to be considered severe in nature.  Per antibiotic stewardship committee, in the setting of severe nonpurulent cellulitis, there is an indication for broad-spectrum IV  antibiotics in the form of IV vancomycin and Rocephin, which will be observed.  No additional evidence of underlying infectious process at this time, including COVID-19/influenza PCR which were found to be negative, chest x-ray shows no evidence of infiltrate to suggest underlying pneumonia.  Urinalysis has been ordered, with result currently pending.  Of note, left lower extremity venous ultrasound showed no evidence of DVT.    Plan: Abx in form of IV vancomycin and Rocephin, per rationale above.  Check blood cultures x2.  Lactated Ringer's at 75 cc/h.  Repeat lactate in 2 hours.  Follow-up result urinalysis.  Repeat CBC  with differential in the morning.  I placed a nursing communication order for assistance with elevation of the left lower extremity for assistance with drainage of associated swelling.  As needed acetaminophen.      #) Lactic acidosis: Presenting lactate elevated at 5.4.  Suspect that this elevation is multifactorial, with contribution from severe sepsis, but also several additional factors, including contribution from elevated CPK in the context of prolonged downtime after fall at home earlier today.  It is possible there may also be an additional contribution from acute kidney injury, although it is unclear to me if the presenting serum creatinine of 1.3 it represents the patient's baseline for an elevation relative to that as there are no prior serum creatinine data points available in the chart at this time.  There may also be a slight contribution from type B lactic acidosis given inclusion of metformin on home medication list.  Check INR to evaluate underlying hepatic synthetic function.  Will warrant additional IV fluids for nephro protective purposes, wall closely monitoring for worsening of volume overload given presence of subacute decompensated heart failure at time of presentation, as further detailed above.  Plan: Repeat lactate in 2 hours.  Lactated Ringer's at 75 cc/h.   Monitor on telemetry.  Repeat BMP in the morning.  Monitor strict I's and O's and daily weights.  Hold home metformin.  Check INR.  Further evaluation and management of presenting severe sepsis due to left lower extremity cellulitis, as further detailed above.        #) Acute on chronic diastolic heart failure: The patient reports a history of chronic underlying heart failure, although my chart review has not revealed any prior echocardiogram results.  The patient reports that he is on a single diuretic medication at home, and reports good compliance with this medication.  However, per pharmacy consultation, the patient is not currently prescribed any diuretic.  I have asked the inpatient pharmacist to further investigate this inconsistency, including the nature of the previously prescribed diuretic medication as well as the timing of its discontinuation as an outpatient.  Evidence of subacute decompensation of chronic heart failure in the form of the patient's report of 7 days of gradual progressive shortness of breath associated with orthopnea and PND.  He also notes unintentional weight gain of approximately 20 pounds over the last 6 months, without any recent acceleration of this rate of increased.  Additionally, presenting chest x-ray shows evidence of interstitial edema, while BNP is elevated at 316, but in the absence of any prior corresponding values for point comparison.  The patient will ultimately need additional IV diuresis, although given the apparent subacute nature of his decompensated heart failure as well as relative stability from the standpoint, all in the context of presenting severe signs of sepsis and lactic acidosis with elevated CPK, priority will initially go to evaluation management of severe sepsis, lactic acidosis, and CPK elevation.  That being said, we will attempt to be more gentle and IV fluid administration given complicating factors that exist in the setting of subacute [place]  decompensated heart failure.  Plan: Monitor strict I's and O's and daily weights.  Have ordered an echocardiogram.  Monitor on telemetry.  Monitor continuous pulse oximetry.  Repeat CMP and serum magnesium level in the morning.  Formal inpatient pharmacy consult for assistance with determination of the patient's previous diuretic regimen, as well as the timeframe of its discontinuation.  We will continue home beta-blocker, but at reduced dose given evidence of first-degree  AV block on presenting EKG as well as in the context of acute on chronic heart failure.      #) Elevated troponin: Mildly elevated initial troponin of 41, with no prior value in the chart available for point comparison.  Suspect that this is most consistent with supply demand mismatch as a consequence of acutely decompensated heart failure as opposed to representing a type I plaque rupture leading to acutely decompensated heart failure.  Other influences likely contributing to a supine management scenario with resultant increase in troponin include presenting severe sepsis due to left lower extremity cellulitis.  Overall, ACS is felt to be less likely at this time given no recent chest pain, EKG showed no evidence of acute ischemic changes.  Will further trend troponin values, engaging in additional evaluation and management of acute on chronic diastolic heart failure as well as severe sepsis due to left lower extremity cellulitis, as further detailed above.  Plan: Trend serial troponin.  Monitor on telemetry.  Monitor continuous pulse oximetry.  Echocardiogram has been ordered.  We will monitor for result of this, including for evidence of focal wall motion abnormalities.  Full dose aspirin administered.  Resumption of half dose of home beta-blocker, as above.  Resume home simvastatin.      #) Generalized weakness: Patient reports 3 to 4 days of generalized weakness in the absence of any acute focal neurologic deficits.  Appears  multifactorial in nature, with contributions from physiologic stressors that include severe sepsis due to left lower extremity cellulitis as well as subacutely decompensated heart failure superimposed on patient's report of chronic diastolic heart failure.  Plan: Work-up and management of presents for sepsis due to left lower extremity cellulitis as well as acute on chronic diastolic heart failure, as above.  Fall precautions ordered.  In the setting of elevated troponin work-up, will hold off on physical therapy consult for now, pending further trending and evaluation of this elevated value.  Check TSH and MMA.  Follow-up result urinalysis.     #) Hypomagnesemia: Presenting serum magnesium level noted to be 1.6.  Plan: Magnesium sulfate 2 g IV every 2 hours x1 now.  Repeat serum magnesium level in the morning, monitor on telemetry.      #) Essential hypertension: Outpatient hypertensive regimen includes Toprol-XL, Norvasc, and benazepril.  We will continue patient's home Toprol-XL, but half dose in the setting of presenting EKG no string evidence of first-degree AV block as well as in the setting of acute on chronic diastolic heart failure.  Furthermore, in the setting of presenting severe sepsis, will hold home Norvasc and benazepril for now, with close monitoring of ensuing blood pressures.  Plan: Resume Toprol-XL at half dose, as described above.  Hold home benazepril and Norvasc for now.  Close monitoring of ensuing blood pressure via routine vital signs.  Repeat BMP in the morning.       #) Type 2 diabetes mellitus: Noninsulin-dependent.  On metformin as well as Jardiance as an outpatient.  Presenting blood sugar per presenting CMP noted to be 226 in the absence of any anion gap metabolic acidosis.  Plan: Hold home oral hypoglycemic agents.  Accu-Cheks before every meal and at bedtime with low-dose sliding scale insulin.  Check hemoglobin A1c.     DVT prophylaxis: Lovenox 40 mg  subcu daily Code Status: Per my discussions with the patient this evening, he conveys his wishes to be DNR/DNI Family Communication: The patient's case was discussed with his son, who was present at bedside  Disposition Plan: Per Rounding Team Consults called: none  Admission status: Inpatient; PCU.     Of note, this patient was added by me to the following Admit List/Treatment Team: armcadmits.      PLEASE NOTE THAT DRAGON DICTATION SOFTWARE WAS USED IN THE CONSTRUCTION OF THIS NOTE.   [facility] Pager [phone] From [place]  Otherwise, please contact night-coverage  [email or web address] Password [facility]   05/28/2020, 7:18 PM

## 2020-05-28 NOTE — ED Notes (Signed)
Hospitalist at bedside  /    Per Nurse giving report , cultures weren't sent , unable to get from IV, Lab called

## 2020-05-28 NOTE — ED Notes (Signed)
Transport requested

## 2020-05-28 NOTE — ED Notes (Signed)
Message sent to floor nurse  

## 2020-05-28 NOTE — ED Notes (Signed)
Family at bedside  Per family he has had freq falls over the past few days  Also had not eaten or drank anything today  Provider aware

## 2020-05-28 NOTE — ED Notes (Signed)
Lab called and advised blood cultures needed , trop and lactic acid .

## 2020-05-28 NOTE — ED Notes (Signed)
Critical Lactic Acid of 5.4 verbally given to Dr Tamala Julian with verbal acknowledgement.

## 2020-05-28 NOTE — ED Notes (Signed)
Sent pharmacy a message regarding not receiving mag , floor nurse advised

## 2020-05-28 NOTE — ED Notes (Signed)
Message charge nurse advised nurse was not assigned to pt

## 2020-05-29 ENCOUNTER — Inpatient Hospital Stay (HOSPITAL_COMMUNITY)
Admit: 2020-05-29 | Discharge: 2020-05-29 | Disposition: A | Discharge status: Home / Self Care | Payer: PPO | Attending: Internal Medicine | Admitting: Internal Medicine

## 2020-05-29 DIAGNOSIS — I10 Essential (primary) hypertension: Secondary | ICD-10-CM | POA: Diagnosis present

## 2020-05-29 DIAGNOSIS — R531 Weakness: Secondary | ICD-10-CM

## 2020-05-29 DIAGNOSIS — E119 Type 2 diabetes mellitus without complications: Secondary | ICD-10-CM

## 2020-05-29 DIAGNOSIS — A419 Sepsis, unspecified organism: Secondary | ICD-10-CM | POA: Diagnosis present

## 2020-05-29 DIAGNOSIS — R7989 Other specified abnormal findings of blood chemistry: Secondary | ICD-10-CM | POA: Diagnosis present

## 2020-05-29 DIAGNOSIS — I440 Atrioventricular block, first degree: Secondary | ICD-10-CM

## 2020-05-29 DIAGNOSIS — I5033 Acute on chronic diastolic (congestive) heart failure: Secondary | ICD-10-CM | POA: Diagnosis present

## 2020-05-29 DIAGNOSIS — E872 Acidosis, unspecified: Secondary | ICD-10-CM | POA: Diagnosis present

## 2020-05-29 DIAGNOSIS — I5031 Acute diastolic (congestive) heart failure: Secondary | ICD-10-CM

## 2020-05-29 DIAGNOSIS — R6520 Severe sepsis without septic shock: Secondary | ICD-10-CM | POA: Diagnosis present

## 2020-05-29 DIAGNOSIS — R748 Abnormal levels of other serum enzymes: Secondary | ICD-10-CM | POA: Diagnosis present

## 2020-05-29 DIAGNOSIS — R778 Other specified abnormalities of plasma proteins: Secondary | ICD-10-CM | POA: Diagnosis present

## 2020-05-29 LAB — VITAMIN B12: Vitamin B-12: 59 pg/mL — ABNORMAL LOW (ref 180–914)

## 2020-05-29 LAB — COMPREHENSIVE METABOLIC PANEL
ALT: 30 U/L (ref 0–44)
AST: 78 U/L — ABNORMAL HIGH (ref 15–41)
Albumin: 3.3 g/dL — ABNORMAL LOW (ref 3.5–5.0)
Alkaline Phosphatase: 36 U/L — ABNORMAL LOW (ref 38–126)
Anion gap: 11 (ref 5–15)
BUN: 27 mg/dL — ABNORMAL HIGH (ref 8–23)
CO2: 24 mmol/L (ref 22–32)
Calcium: 8.4 mg/dL — ABNORMAL LOW (ref 8.9–10.3)
Chloride: 102 mmol/L (ref 98–111)
Creatinine, Ser: 1.02 mg/dL (ref 0.61–1.24)
GFR, Estimated: >60 mL/min (ref >60)
Glucose, Bld: 142 mg/dL — ABNORMAL HIGH (ref 70–99)
Potassium: 4.3 mmol/L (ref 3.5–5.1)
Sodium: 137 mmol/L (ref 135–145)
Total Bilirubin: 1 mg/dL (ref 0.3–1.2)
Total Protein: 6.8 g/dL (ref 6.5–8.1)

## 2020-05-29 LAB — ECHOCARDIOGRAM COMPLETE
AR max vel: 2.58 cm2
AV Area VTI: 2.61 cm2
AV Area mean vel: 2.34 cm2
AV Mean grad: 4.5 mmHg
AV Peak grad: 9.2 mmHg
Ao pk vel: 1.52 m/s
Area-P 1/2: 4.17 cm2
Height: 69 in
S' Lateral: 2.54 cm
Weight: 4707.2 oz

## 2020-05-29 LAB — CBC WITH DIFFERENTIAL/PLATELET
Abs Immature Granulocytes: 0.11 10*3/uL — ABNORMAL HIGH (ref 0.00–0.07)
Basophils Absolute: 0.1 10*3/uL (ref 0.0–0.1)
Basophils Relative: 1 %
Eosinophils Absolute: 0.1 10*3/uL (ref 0.0–0.5)
Eosinophils Relative: 0 %
HCT: 41.4 % (ref 39.0–52.0)
Hemoglobin: 14.3 g/dL (ref 13.0–17.0)
Immature Granulocytes: 1 %
Lymphocytes Relative: 13 %
Lymphs Abs: 2.3 10*3/uL (ref 0.7–4.0)
MCH: 32.6 pg (ref 26.0–34.0)
MCHC: 34.5 g/dL (ref 30.0–36.0)
MCV: 94.5 fL (ref 80.0–100.0)
Monocytes Absolute: 1.2 10*3/uL — ABNORMAL HIGH (ref 0.1–1.0)
Monocytes Relative: 7 %
Neutro Abs: 14.3 10*3/uL — ABNORMAL HIGH (ref 1.7–7.7)
Neutrophils Relative %: 78 %
Platelets: 158 10*3/uL (ref 150–400)
RBC: 4.38 MIL/uL (ref 4.22–5.81)
RDW: 13.4 % (ref 11.5–15.5)
WBC: 18.1 10*3/uL — ABNORMAL HIGH (ref 4.0–10.5)
nRBC: 0 % (ref 0.0–0.2)

## 2020-05-29 LAB — GLUCOSE, CAPILLARY
Glucose-Capillary: 188 mg/dL — ABNORMAL HIGH (ref 70–99)
Glucose-Capillary: 179 mg/dL — ABNORMAL HIGH (ref 70–99)
Glucose-Capillary: 146 mg/dL — ABNORMAL HIGH (ref 70–99)
Glucose-Capillary: 154 mg/dL — ABNORMAL HIGH (ref 70–99)

## 2020-05-29 LAB — PHOSPHORUS: Phosphorus: 3.8 mg/dL (ref 2.5–4.6)

## 2020-05-29 LAB — URIC ACID: Uric Acid, Serum: 8.9 mg/dL — ABNORMAL HIGH (ref 3.7–8.6)

## 2020-05-29 LAB — MAGNESIUM: Magnesium: 2.1 mg/dL (ref 1.7–2.4)

## 2020-05-29 LAB — VITAMIN D 25 HYDROXY (VIT D DEFICIENCY, FRACTURES): Vit D, 25-Hydroxy: 61.67 ng/mL (ref 30–100)

## 2020-05-29 LAB — LACTIC ACID, PLASMA: Lactic Acid, Venous: 2.3 mmol/L (ref 0.5–1.9)

## 2020-05-29 LAB — MRSA PCR SCREENING: MRSA by PCR: NEGATIVE

## 2020-05-29 LAB — PROTIME-INR
INR: 1.3 — ABNORMAL HIGH (ref 0.8–1.2)
Prothrombin Time: 16.5 seconds — ABNORMAL HIGH (ref 11.4–15.2)

## 2020-05-29 LAB — SALICYLATE LEVEL: Salicylate Lvl: <7 mg/dL — ABNORMAL LOW (ref 7.0–30.0)

## 2020-05-29 MED ORDER — HYDROCODONE-ACETAMINOPHEN 5-325 MG PO TABS
1.0000 | ORAL_TABLET | ORAL | Status: DC | PRN
  Administered 2020-05-29 – 2020-05-30 (×4): 2 via ORAL
  Filled 2020-05-29 (×4): qty 2

## 2020-05-29 MED ORDER — ENOXAPARIN SODIUM 80 MG/0.8ML IJ SOSY
0.5000 mg/kg | PREFILLED_SYRINGE | INTRAMUSCULAR | Status: DC
  Administered 2020-05-29 – 2020-05-30 (×2): 67.5 mg via SUBCUTANEOUS
  Filled 2020-05-29 (×2): qty 0.8

## 2020-05-29 MED ORDER — CYANOCOBALAMIN 1000 MCG/ML IJ SOLN
1000.0000 ug | Freq: Every day | INTRAMUSCULAR | Status: DC
  Administered 2020-05-29 – 2020-05-30 (×2): 1000 ug via INTRAMUSCULAR
  Filled 2020-05-29 (×2): qty 1

## 2020-05-29 MED ORDER — VANCOMYCIN HCL 1750 MG/350ML IV SOLN
1750.0000 mg | INTRAVENOUS | Status: DC
  Filled 2020-05-29: qty 350

## 2020-05-29 MED ORDER — INSULIN GLARGINE 100 UNIT/ML ~~LOC~~ SOLN
10.0000 [IU] | Freq: Every day | SUBCUTANEOUS | Status: DC
  Administered 2020-05-29: 10 [IU] via SUBCUTANEOUS
  Filled 2020-05-29 (×2): qty 0.1

## 2020-05-29 MED ORDER — COLCHICINE 0.6 MG PO TABS
0.6000 mg | ORAL_TABLET | Freq: Two times a day (BID) | ORAL | Status: DC
  Administered 2020-05-29 – 2020-05-30 (×3): 0.6 mg via ORAL
  Filled 2020-05-29 (×3): qty 1

## 2020-05-29 NOTE — Progress Notes (Signed)
Inpatient Diabetes Program Recommendations  AACE/ADA: New Consensus Statement on Inpatient Glycemic Control (2015)  Target Ranges:  Prepandial:   less than 140 mg/dL      Peak postprandial:   less than 180 mg/dL (1-2 hours)      Critically ill patients:  140 - 180 mg/dL   Lab Results  Component Value Date   GLUCAP 188 (H) 05/29/2020   HGBA1C 7.2 (H) 05/28/2020    Review of Glycemic Control Results for Jordan Drake, Jordan Drake (MRN [id number]) as of 05/29/2020 08:35  Ref. Range 05/28/2020 20:57 05/29/2020 08:02  Glucose-Capillary Latest Ref Range: 70 - 99 mg/dL 266 (H) 188 (H)   Diabetes history: DM 2 Outpatient Diabetes medications:  Amaryl 1 mg daily, Metformin 500 mg bid Current orders for Inpatient glycemic control:  Novolog sensitive tid with meals Inpatient Diabetes Program Recommendations:    While in the hospital and oral agents on hold, consider adding Lantus 10 units daily.   Thanks,  Adah Perl, RN, BC-ADM Inpatient Diabetes Coordinator Pager [phone] (8a-5p)

## 2020-05-29 NOTE — Progress Notes (Signed)
*  PRELIMINARY RESULTS* Echocardiogram 2D Echocardiogram has been performed.  Jordan Drake 05/29/2020, 1:25 PM

## 2020-05-29 NOTE — Evaluation (Signed)
Occupational Therapy Evaluation Patient Details Name: Jordan Drake MRN: [id number] DOB: 1940/08/04 Today's Date: 05/29/2020    History of Present Illness 80 y.o. male with medical history significant for chronic heart failure with preserved EF, hypertension, type 2 diabetes mellitus, coronary artery disease status post CABG,  Who is admitted to [facility] on 05/28/2020 with severe sepsis due to left lower extremity cellulitis after presenting from home to [facility] ED complaining of left lower extremity erythema.   Patient walks with the help of 2 canes and also had a mechanical fall before coming to the hospital.  No significant injury.   Clinical Impression   Patient presenting with decreased I in self care, balance, functional mobility/transfers, endurance, and safety awareness. Patient reports being mod I at baseline and living with wife. Pt ambulates with 2 SPC. He does endorse getting around in home and loves to mow the grass with riding lawn mower. Pt needing max A to get to EOB with mod lifting assistance to stand from EOB. Pt taking several side steps while in RW with mod A to [facility] for BM. Pt fatigues very quickly this session and B LEs shake with effort. Pt reporting, " I feel very weak". No BM this session. Pt again with increased effort to return back to bed in same manner. Pt returning to supine with total A and needing +2 assistance to reposition in bed. OT recommends short term rehab stay based on pt's current level of care and concerns that his wife will be unable to provide this amount of assistance. When pt fell at home, prior to admission, wife attempted to slow him down and they both fell together on the floor per pt report. Patient will benefit from acute OT to increase overall independence in the areas of ADLs, functional mobility,and safety awareness in order to safely discharge to next venue of care.    Follow Up Recommendations  SNF;Supervision/Assistance - 24  hour    Equipment Recommendations  3 in 1 bedside commode       Precautions / Restrictions Precautions Precautions: Fall      Mobility Bed Mobility Overal bed mobility: Needs Assistance Bed Mobility: Supine to Sit;Sit to Supine     Supine to sit: Max assist Sit to supine: Max assist   General bed mobility comments: assistance for trunk support and B LEs    Transfers Overall transfer level: Needs assistance Equipment used: Rolling walker (2 wheeled) Transfers: Sit to/from [facility] Sit to Stand: Mod assist Stand pivot transfers: Mod assist       General transfer comment: mod A to power up from [facility] and EOB    Balance Overall balance assessment: Needs assistance Sitting-balance support: Feet supported Sitting balance-Leahy Scale: Good     Standing balance support: During functional activity Standing balance-Leahy Scale: Poor                             ADL either performed or assessed with clinical judgement   ADL Overall ADL's : Needs assistance/impaired Eating/Feeding: Independent                       Toilet Transfer: Maximal assistance;RW;BSC   Toileting- Clothing Manipulation and Hygiene: Maximal assistance;Sitting/lateral lean       Functional mobility during ADLs: Moderate assistance;Rolling walker       Vision Patient Visual Report: No change from baseline  Pertinent Vitals/Pain Pain Assessment: Faces Faces Pain Scale: Hurts even more Pain Location: L ankle Pain Descriptors / Indicators: Aching;Discomfort Pain Intervention(s): Limited activity within patient's tolerance;Premedicated before session;Repositioned     Hand Dominance Right   Extremity/Trunk Assessment Upper Extremity Assessment Upper Extremity Assessment: Overall WFL for tasks assessed;Generalized weakness   Lower Extremity Assessment Lower Extremity Assessment: Generalized weakness (edema noted in B LEs)        Communication Communication Communication: No difficulties   Cognition Arousal/Alertness: Awake/alert Behavior During Therapy: WFL for tasks assessed/performed Overall Cognitive Status: Within Functional Limits for tasks assessed                                 General Comments: Pt is pleasant and cooperative. A & O x4              Home Living Family/patient expects to be discharged to:: Private residence Living Arrangements: Spouse/significant other Available Help at Discharge: Available 24 hours/day Type of Home: House Home Access: Level entry;Other (comment) (1-2 steps up into kitchen area)     Home Layout: One level     Bathroom Shower/Tub: Walk-in shower         Home Equipment: [place] - single point;Wheelchair - manual;Shower seat;Grab bars - tub/shower;Grab bars - toilet;Hand held shower head          Prior Functioning/Environment Level of Independence: Independent with assistive device(s)        Comments: Pt reports ambulating with 2 SPCs in his home. He reports , " I can walk as much as I want to". Pt reports cutting grass with riding lawn mower and still driving.        OT Problem List: Decreased strength;Impaired balance (sitting and/or standing);Decreased cognition;Pain;Decreased safety awareness;Decreased range of motion;Cardiopulmonary status limiting activity;Decreased activity tolerance;Decreased knowledge of use of DME or AE      OT Treatment/Interventions: Self-care/ADL training;Manual therapy;Patient/family education;Balance training;Energy conservation;Therapeutic activities;Cognitive remediation/compensation;DME and/or AE instruction    OT Goals(Current goals can be found in the care plan section) Acute Rehab OT Goals Patient Stated Goal: to get out of this bed OT Goal Formulation: With patient/family Time For Goal Achievement: 06/12/20 Potential to Achieve Goals: Good ADL Goals Pt Will Perform Grooming: with  supervision;standing Pt Will Perform Lower Body Dressing: with supervision;sit to/from stand Pt Will Transfer to Toilet: with supervision;ambulating Pt Will Perform Toileting - Clothing Manipulation and hygiene: with supervision;sit to/from stand  OT Frequency: Min 2X/week   Barriers to D/C:    Concerns that pt's wife is unable to provide this level of assistance          AM-PAC OT "6 Clicks" Daily Activity     Outcome Measure Help from another person eating meals?: None Help from another person taking care of personal grooming?: A Little Help from another person toileting, which includes using toliet, bedpan, or urinal?: A Lot Help from another person bathing (including washing, rinsing, drying)?: A Lot Help from another person to put on and taking off regular upper body clothing?: A Little Help from another person to put on and taking off regular lower body clothing?: A Lot 6 Click Score: 16   End of Session Equipment Utilized During Treatment: Rolling walker;Other (comment) [facility]) Nurse Communication: Mobility status  Activity Tolerance: Patient limited by fatigue Patient left: in bed;with call bell/phone within reach;with bed alarm set;with family/visitor present  OT Visit Diagnosis: Unsteadiness on feet (R26.81);Repeated falls (R29.6);Muscle weakness (generalized) (  M62.81)                Time: 5397-6734 OT Time Calculation (min): 50 min Charges:  OT Evaluation $OT Eval Moderate Complexity: 1 Mod OT Treatments $Self Care/Home Management : 38-52 mins  Darleen Crocker, MS, OTR/L , CBIS ascom [phone]  05/29/20, 3:37 PM

## 2020-05-29 NOTE — Progress Notes (Signed)
PROGRESS NOTE    Jordan Drake  KCL:[id number] DOB: Jun 08, 1940 DOA: 05/28/2020 PCP: Prince Solian, MD   Brief Narrative: Taken from H&P. Jordan Drake is a 80 y.o. male with medical history significant for chronic heart failure with preserved EF, hypertension, type 2 diabetes mellitus, coronary artery disease status post CABG,  Who is admitted to [facility] on 05/28/2020 with severe sepsis due to left lower extremity cellulitis after presenting from home to [facility] ED complaining of left lower extremity erythema.  Patient walks with the help of 2 canes and also had a mechanical fall before coming to the hospital.  No significant injury.  Subjective: Patient was complaining of left ankle and lower extremity pain.  Left lower extremity edema and erythema seems improving.  Son at bedside.  Denies any prior history of gout.  He was also complaining of tingling and numbness of bilateral feet.  Denies any burning pain.  Assessment & Plan:   Principal Problem:   Cellulitis of left lower extremity Active Problems:   Severe sepsis (HCC)   Acute on chronic diastolic CHF (congestive heart failure) (HCC)   Elevated troponin   Generalized weakness   Lactic acidosis   Elevated CPK   Essential hypertension   Diabetes mellitus without complication (HCC)  Severe sepsis secondary to left lower extremity cellulitis.  Patient met sepsis criteria with fever, leukocytosis, AKI and markedly elevated lactic acid at 6>2.3. Clinically seems improving.  He was started on ceftriaxone and vancomycin for cellulitis. -We will check MRSA screen-if negative can discontinue vancomycin. -Continue ceftriaxone and vancomycin for now.  Left ankle pain.  No prior history of gout.  Uric acid was checked and it was elevated at 8.9.  Might be some element of gout with cellulitis of left extremity. No obvious joint abnormality. -Add colchicine  Generalized weakness/history of falls with bilateral  feet paresthesias. -B12 was checked and it was low at 59. -Vitamin D levels pending -Replace B12 with IM injections for 1 week followed by p.o. replacement. -PT/OT evaluation.  History of chronic HFpEF.  There was some concern of acute on chronic HFpEF with some weight gain and orthopnea. He was not given any diuretic at this time due to concern of sepsis. -Repeat echocardiogram ordered-pending. -Continue with daily BMP and weight -Strict intake and output -Continue with reduced dose of beta-blocker-it was reduced due to evidence of first-degree heart block on EKG.  History of CAD/elevated troponin.  Most likely secondary to demand ischemia.  BNP at 316 and troponin with a flat curve at 52.  No chest pain -Continue with home statin.  Hypomagnesemia.  Resolved with repletion. -Continue to monitor  Hypertension.  Currently blood pressure within goal. Home antihypertensives were held initially due to concern of sepsis. -Can be restarted if needed  Type 2 diabetes mellitus.  Elevated CBG with A1c of 7.2. Home dose of metformin and Amaryl is being held. -Add Lantus 10 units at bedtime -Continue with SSI  Objective: Vitals:   05/28/20 2305 05/29/20 0349 05/29/20 0759 05/29/20 1108  BP: (!) 111/51 (!) 105/57 (!) 136/58 (!) 144/58  Pulse: 75 (!) 58 72 73  Resp: _0 Temp: 98.8 F (37.1 C) 97.8 F (36.6 C)  99.4 F (37.4 C)  TempSrc: Oral Oral  Oral  SpO2: 95% 96% 98% 94%  Weight:  133.4 kg    Height:        Intake/Output Summary (Last 24 hours) at 05/29/2020 1348 Last data filed at  05/29/2020 0950 Gross per 24 hour  Intake 861.1 ml  Output 800 ml  Net 61.1 ml   Filed Weights   05/28/20 1635 05/29/20 0349  Weight: 126.1 kg 133.4 kg    Examination:  General exam: Appears calm and comfortable  Respiratory system: Clear to auscultation. Respiratory effort normal. Cardiovascular system: S1 & S2 heard, RRR. No JVD, murmurs, rubs, gallops or clicks. Gastrointestinal  system: Soft, nontender, nondistended, bowel sounds positive. Central nervous system: Alert and oriented. No focal neurological deficits.Symmetric 5 x 5 power. Extremities: No edema, no cyanosis, pulses intact and symmetrical. Skin: No rashes, lesions or ulcers Psychiatry: Judgement and insight appear normal. Mood & affect appropriate.    DVT prophylaxis: Lovenox Code Status: DNR Family Communication: Son was updated at bedside Disposition Plan:  Status is: Inpatient  Remains inpatient appropriate because:Inpatient level of care appropriate due to severity of illness   Dispo: The patient is from: Home              Anticipated d/c is to: Home              Patient currently is not medically stable to d/c.   Difficult to place patient No             Level of care: Med-Surg  All the records are reviewed and case discussed with Care Management/Social Worker. Management plans discussed with the patient, nursing and they are in agreement.  Consultants:   None  Procedures:  Antimicrobials:  Ceftriaxone Vancomycin  Data Reviewed: I have personally reviewed following labs and imaging studies  CBC: Recent Labs  Lab 05/28/20 1721 05/29/20 0545  WBC 23.3* 18.1*  NEUTROABS 20.8* 14.3*  HGB 14.5 14.3  HCT 42.9 41.4  MCV 95.3 94.5  PLT 175 021   Basic Metabolic Panel: Recent Labs  Lab 05/28/20 1721 05/29/20 0545  NA 139 137  K 4.7 4.3  CL 103 102  CO2 22 24  GLUCOSE 226* 142*  BUN 27* 27*  CREATININE 1.38* 1.02  CALCIUM 8.9 8.4*  MG 1.6* 2.1  PHOS  --  3.8   GFR: Estimated Creatinine Clearance: 77 mL/min (by C-G formula based on SCr of 1.02 mg/dL). Liver Function Tests: Recent Labs  Lab 05/28/20 1721 05/29/20 0545  AST 67* 78*  ALT 32 30  ALKPHOS 37* 36*  BILITOT 1.0 1.0  PROT 7.3 6.8  ALBUMIN 3.7 3.3*   No results for input(s): LIPASE, AMYLASE in the last 168 hours. No results for input(s): AMMONIA in the last 168 hours. Coagulation Profile: Recent  Labs  Lab 05/29/20 0545  INR 1.3*   Cardiac Enzymes: Recent Labs  Lab 05/28/20 2005  CKTOTAL 1,886*   BNP (last 3 results) No results for input(s): PROBNP in the last 8760 hours. HbA1C: Recent Labs    05/28/20 2001  HGBA1C 7.2*   CBG: Recent Labs  Lab 05/28/20 2057 05/29/20 0802 05/29/20 1231  GLUCAP 266* 188* 179*   Lipid Profile: No results for input(s): CHOL, HDL, LDLCALC, TRIG, CHOLHDL, LDLDIRECT in the last 72 hours. Thyroid Function Tests: Recent Labs    05/28/20 2001  TSH 0.978   Anemia Panel: No results for input(s): VITAMINB12, FOLATE, FERRITIN, TIBC, IRON, RETICCTPCT in the last 72 hours. Sepsis Labs: Recent Labs  Lab 05/28/20 1722 05/28/20 2014 05/29/20 0545  LATICACIDVEN 5.4* 6.0* 2.3*    Recent Results (from the past 240 hour(s))  Resp Panel by RT-PCR (Flu A&B, Covid) Nasopharyngeal Swab     Status: None  Collection Time: 05/28/20  4:58 PM   Specimen: Nasopharyngeal Swab; Nasopharyngeal(NP) swabs in vial transport medium  Result Value Ref Range Status   SARS Coronavirus 2 by RT PCR NEGATIVE NEGATIVE Final    Comment: (NOTE) SARS-CoV-2 target nucleic acids are NOT DETECTED.  The SARS-CoV-2 RNA is generally detectable in upper respiratory specimens during the acute phase of infection. The lowest concentration of SARS-CoV-2 viral copies this assay can detect is 138 copies/mL. A negative result does not preclude SARS-Cov-2 infection and should not be used as the sole basis for treatment or other patient management decisions. A negative result may occur with  improper specimen collection/handling, submission of specimen other than nasopharyngeal swab, presence of viral mutation(s) within the areas targeted by this assay, and inadequate number of viral copies(<138 copies/mL). A negative result must be combined with clinical observations, patient history, and epidemiological information. The expected result is Negative.  Fact Sheet for  Patients:  EntrepreneurPulse.com.au  Fact Sheet for Healthcare Providers:  IncredibleEmployment.be  This test is no t yet approved or cleared by the [place] FDA and  has been authorized for detection and/or diagnosis of SARS-CoV-2 by FDA under an Emergency Use Authorization (EUA). This EUA will remain  in effect (meaning this test can be used) for the duration of the COVID-19 declaration under Section 564(b)(1) of the Act, 21 U.S.C.section 360bbb-3(b)(1), unless the authorization is terminated  or revoked sooner.       Influenza A by PCR NEGATIVE NEGATIVE Final   Influenza B by PCR NEGATIVE NEGATIVE Final    Comment: (NOTE) The Xpert Xpress SARS-CoV-2/FLU/RSV plus assay is intended as an aid in the diagnosis of influenza from Nasopharyngeal swab specimens and should not be used as a sole basis for treatment. Nasal washings and aspirates are unacceptable for Xpert Xpress SARS-CoV-2/FLU/RSV testing.  Fact Sheet for Patients: EntrepreneurPulse.com.au  Fact Sheet for Healthcare Providers: IncredibleEmployment.be  This test is not yet approved or cleared by the [place] FDA and has been authorized for detection and/or diagnosis of SARS-CoV-2 by FDA under an Emergency Use Authorization (EUA). This EUA will remain in effect (meaning this test can be used) for the duration of the COVID-19 declaration under Section 564(b)(1) of the Act, 21 U.S.C. section 360bbb-3(b)(1), unless the authorization is terminated or revoked.  Performed at [facility], [address] [place], [place] [address]      Radiology Studies: DG Ankle 2 Views Left  Result Date: 05/28/2020 CLINICAL DATA:  Status post fall. EXAM: LEFT ANKLE - 2 VIEW COMPARISON:  None. FINDINGS: There is no evidence of an acute fracture, dislocation, or joint effusion. Small chronic changes are seen involving the left lateral malleolus  and anterior aspect of the distal right tibia. There is a small to moderate sized plantar calcaneal spur. Diffuse soft tissue swelling is seen which is likely secondary to the patient's body habitus. Diffuse distortion of the subcutaneous fat is seen. Marked severity vascular calcification is noted. IMPRESSION: 1. Small chronic changes without an acute osseous abnormality. 2. Additional findings consistent with diffuse cellulitis. Electronically Signed   By: Virgina Norfolk M.D.   On: 05/28/2020 17:57   US Venous Img Lower Unilateral Left  Result Date: 05/28/2020 CLINICAL DATA:  80 year old with left lower extremity swelling. EXAM: LEFT LOWER EXTREMITY VENOUS DOPPLER ULTRASOUND TECHNIQUE: Gray-scale sonography with graded compression, as well as color Doppler and duplex ultrasound were performed to evaluate the lower extremity deep venous systems from the level of the common femoral vein and  including the common femoral, femoral, profunda femoral, popliteal and calf veins including the posterior tibial, peroneal and gastrocnemius veins when visible. The superficial great saphenous vein was also interrogated. Spectral Doppler was utilized to evaluate flow at rest and with distal augmentation maneuvers in the common femoral, femoral and popliteal veins. COMPARISON:  None. FINDINGS: Contralateral Common Femoral Vein: Respiratory phasicity is normal and symmetric with the symptomatic side. No evidence of thrombus. Normal compressibility. Common Femoral Vein: No evidence of thrombus. Normal compressibility, respiratory phasicity and response to augmentation. Saphenofemoral Junction: No evidence of thrombus. Normal compressibility and flow on color Doppler imaging. Profunda Femoral Vein: No evidence of thrombus. Normal compressibility and flow on color Doppler imaging. Femoral Vein: No evidence of thrombus. Normal compressibility, respiratory phasicity and response to augmentation. Popliteal Vein: No evidence of  thrombus. Normal compressibility, respiratory phasicity and response to augmentation. Calf Veins: Visualized left deep calf veins are patent without thrombus. Limited evaluation. Other Findings:  None. IMPRESSION: Negative for deep venous thrombosis in left lower extremity. Limited evaluation of the left deep calf veins. Electronically Signed   By: Markus Daft M.D.   On: 05/28/2020 18:20   DG Chest Portable 1 View  Result Date: 05/28/2020 CLINICAL DATA:  Status post fall. EXAM: PORTABLE CHEST 1 VIEW COMPARISON:  None. FINDINGS: Multiple sternal wires and vascular clips are noted. Mild to moderate severity diffusely increased interstitial lung markings are seen. There is no evidence of focal consolidation, pleural effusion or pneumothorax. The cardiac silhouette is mildly enlarged. The visualized skeletal structures are unremarkable. IMPRESSION: 1. Evidence of prior median sternotomy/CABG. 2. Cardiomegaly with increased interstitial lung markings which are likely, in part, chronic in nature. A superimposed component of interstitial edema cannot be excluded. Electronically Signed   By: Virgina Norfolk M.D.   On: 05/28/2020 17:51   DG Foot 2 Views Left  Result Date: 05/28/2020 CLINICAL DATA:  Status post fall. EXAM: LEFT FOOT - 2 VIEW COMPARISON:  None. FINDINGS: A fracture deformity of indeterminate age is seen involving the proximal phalanx of the fifth right toe. There is no evidence of dislocation. Mild chronic changes are seen along the anterior aspect of the distal right tibia. Marked severity diffuse soft tissue swelling is seen with diffuse distortion of the subcutaneous fat throughout the right foot and right ankle. IMPRESSION: 1. Fracture of the proximal phalanx of the fifth right toe of indeterminate age. 2. Additional findings consistent with diffuse cellulitis. Electronically Signed   By: Virgina Norfolk M.D.   On: 05/28/2020 18:12    Scheduled Meds: . enoxaparin (LOVENOX) injection  0.5  mg/kg Subcutaneous Q24H  . insulin aspart  0-9 Units Subcutaneous TID WC  . insulin glargine  10 Units Subcutaneous QHS  . metoprolol succinate  50 mg Oral Daily   Continuous Infusions: . cefTRIAXone (ROCEPHIN)  IV    . vancomycin       LOS: 1 day   Time spent: 36 minutes. More than 50% of the time was spent in counseling/coordination of care  Lorella Nimrod, MD [facility]  If 7PM-7AM, please contact night-coverage [email or web address]  05/29/2020, 1:48 PM   This record has been created using Systems analyst. Errors have been sought and corrected,but may not always be located. Such creation errors do not reflect on the standard of care.

## 2020-05-29 NOTE — Consult Note (Addendum)
Pharmacy Antibiotic Note  Jordan Drake is a 80 y.o. male admitted on 05/28/2020 with cellulitis. PMH includes HFpEF, HTN, diabetes, and CAD s/p CABG. Pt presented with severe sepsis d/t LLE cellulitis. Pharmacy has been consulted for vancomcyin dosing. Pt received Vancomycin 2500 mg IV LD x 1 in ED. Pt is also receiving ceftriaxone 2 g IV q24h.   Plan: --Continue ceftriaxone 2 g IV q24h --Pt Scr improved and recorded weight updated. Increase vancomycin to 1750 mg IV q24h  --Calculated AUC: 509, Cmin 11.9 --Daily Scr per protocol --Levels at steady state as clinically indicated --F/u MRSA PCR, will discontinue vanc if negative  Height: 5\' 9"  (175.3 cm) Weight: 133.4 kg (294 lb 3.2 oz) IBW/kg (Calculated) : 70.7  Temp (24hrs), Avg:98.7 F (37.1 C), Min:97.8 F (36.6 C), Max:100.2 F (37.9 C)  Recent Labs  Lab 05/28/20 1721 05/28/20 1722 05/28/20 2014 05/29/20 0545  WBC 23.3*  --   --  18.1*  CREATININE 1.38*  --   --  1.02  LATICACIDVEN  --  5.4* 6.0* 2.3*    Estimated Creatinine Clearance: 77 mL/min (by C-G formula based on SCr of 1.02 mg/dL).    No Known Allergies  Antimicrobials this admission: Ceftriaxone 5/16 >>  Vancomycin 5/16 >>   Dose adjustments this admission: vanc 1250 q24h>1750 q24h  Microbiology results: 5/16 BCx: NGTD 5/17 MRSA PCR: pending  Thank you for allowing pharmacy to be a part of this patient's care.  Benn Moulder, PharmD Pharmacy Resident  05/29/2020 8:52 AM

## 2020-05-29 NOTE — Progress Notes (Signed)
  Chaplain On-Call responded to Order Requisition which reads "Different options for DNR".  Chaplain spoke with RN Ronalee Belts, who agreed that the OR needs clarification from the Physician, since the Physician is the only one who can authorize DNR orders.  Ronalee Belts will contact Chaplains with additional information as needed.  EPIC records indicate the patient declined information about Advance Directives upon admission.  Chaplain Pollyann Samples M.Div., [facility]

## 2020-05-29 NOTE — Progress Notes (Signed)
PHARMACIST - PHYSICIAN COMMUNICATION  CONCERNING:  Enoxaparin (Lovenox) for DVT Prophylaxis    RECOMMENDATION: Patient was prescribed enoxaprin 40mg  q24 hours for VTE prophylaxis.   Filed Weights   05/28/20 1635 05/29/20 0349  Weight: 126.1 kg (278 lb) 133.4 kg (294 lb 3.2 oz)    Body mass index is 43.45 kg/m.  Estimated Creatinine Clearance: 77 mL/min (by C-G formula based on SCr of 1.02 mg/dL).   Based on [place] patient is candidate for enoxaparin 0.5mg /kg TBW SQ every 24 hours based on BMI being >30.   DESCRIPTION: Pharmacy has adjusted enoxaparin dose per [facility] policy.  Patient is now receiving enoxaparin 0.5 mg/kg (67.5 mg) every 24 hours   Benn Moulder, PharmD Pharmacy Resident  05/29/2020 8:56 AM

## 2020-05-29 NOTE — Evaluation (Signed)
Physical Therapy Evaluation Patient Details Name: Jordan Drake MRN: [id number] DOB: [birth year] Today's Date: 05/29/2020   History of Present Illness  Pt is an 80 y.o. male with medical history significant for chronic heart failure with preserved EF, hypertension, type 2 diabetes mellitus, coronary artery disease status post CABG, admitted on 05/28/2020 with severe sepsis due to left lower extremity cellulitis complaining of left lower extremity erythema.  MD assessment includes: Severe sepsis secondary to left lower extremity cellulitis, L ankle pain, generalized weakness/history of falls with bilateral feet paresthesias, elevated troponin most likely secondary to demand ischemia, and hypomagnesemia.    Clinical Impression  Pt was pleasant and motivated to participate during the session.  Pt required extra time and effort with bed mobility tasks and min A to come to standing.  Pt reported 9/10 L ankle pain at onset of session with education provided on proper step-to sequencing with the RW to assist with offloading the L ankle during gait.  Pt reported no increase in L ankle pain with ambulation and actually reported decreased L ankle pain after ambulation.  Pt did present with two occasions of minor L knee buckling during ambulation with slow, cautious step-to pattern but was able to self correct.  Pt is at elevated risk for falls and injury and would not be safe to return to his prior living situation at this time.  Pt will benefit from PT services in a SNF setting upon discharge to safely address deficits listed in patient problem list for decreased caregiver assistance and eventual return to PLOF.        Follow Up Recommendations SNF    Equipment Recommendations  Other (comment) (Bariatric RW)    Recommendations for Other Services       Precautions / Restrictions Precautions Precautions: Fall Restrictions Weight Bearing Restrictions: No      Mobility  Bed Mobility Overal bed  mobility: Needs Assistance Bed Mobility: Supine to Sit;Sit to Supine     Supine to sit: Supervision Sit to supine: Max assist   General bed mobility comments: Extra time, effort, and heavy use of the bedrail during sup to sit    Transfers Overall transfer level: Needs assistance Equipment used: Rolling walker (2 wheeled) Transfers: Sit to/from Stand Sit to Stand: Min assist;From elevated surface Stand pivot transfers: Mod assist       General transfer comment: Mod verbal cues for hand placement and sequencing  Ambulation/Gait Ambulation/Gait assistance: Min guard Gait Distance (Feet): 15 Feet x 2 Assistive device: Rolling walker (2 wheeled) Gait Pattern/deviations: Step-to pattern;Decreased stance time - left;Decreased step length - right Gait velocity: decreased   General Gait Details: Step-to pattern with mod lean on the RW to offload LLE during stance phase; occasional min L knee buckling during SLS phase that the pt was able to self-correct  Stairs            Wheelchair Mobility    Modified Rankin (Stroke Patients Only)       Balance Overall balance assessment: Needs assistance Sitting-balance support: Feet supported Sitting balance-Leahy Scale: Good     Standing balance support: Bilateral upper extremity supported;During functional activity Standing balance-Leahy Scale: Fair Standing balance comment: Use of the RW for support in standing but no LOB                             Pertinent Vitals/Pain Pain Assessment: 0-10 Pain Score: 9  Faces Pain Scale: Hurts even more Pain  Location: L ankle Pain Descriptors / Indicators: Aching;Discomfort Pain Intervention(s): Premedicated before session;Monitored during session    [place] expects to be discharged to:: Private residence Living Arrangements: Spouse/significant other Available Help at Discharge: Available 24 hours/day Type of Home: House Home Access: Level  entry;Other (comment) (2 interior steps with narrow B rails)     Home Layout: One level Home Equipment: Cane - single point;Wheelchair - manual;Shower seat;Grab bars - tub/shower;Grab bars - toilet;Hand held shower head;Bedside commode;Walker - 4 wheels;Walker - 2 wheels Additional Comments: Pt has a standard size RW, would benefit from a bariatric RW    Prior Function Level of Independence: Independent with assistive device(s)         Comments: Mod Ind amb with a SPC for the last 6 months and with two SPCs more recently; 2 falls in the last 6 months; Ind with ADLs     Hand Dominance   Dominant Hand: Right    Extremity/Trunk Assessment   Upper Extremity Assessment Upper Extremity Assessment: Generalized weakness    Lower Extremity Assessment Lower Extremity Assessment: Generalized weakness       Communication   Communication: No difficulties  Cognition Arousal/Alertness: Awake/alert Behavior During Therapy: WFL for tasks assessed/performed Overall Cognitive Status: Within Functional Limits for tasks assessed                                 General Comments: Pt is pleasant and cooperative. A & O x4      General Comments      Exercises Other Exercises Other Exercises: Verbal and visual cues/education for proper sequencing with gait using the RW   Assessment/Plan    PT Assessment Patient needs continued PT services  PT Problem List Decreased strength;Decreased range of motion;Decreased activity tolerance;Decreased balance;Decreased mobility;Decreased knowledge of use of DME       PT Treatment Interventions DME instruction;Gait training;Stair training;Functional mobility training;Therapeutic activities;Therapeutic exercise;Balance training;Patient/family education    PT Goals (Current goals can be found in the Care Plan section)  Acute Rehab PT Goals Patient Stated Goal: To lose weight and be more active PT Goal Formulation: With patient Time For  Goal Achievement: 06/11/20 Potential to Achieve Goals: Good    Frequency Min 2X/week   Barriers to discharge Inaccessible home environment;Decreased caregiver support      Co-evaluation               AM-PAC PT "6 Clicks" Mobility  Outcome Measure Help needed turning from your back to your side while in a flat bed without using bedrails?: A Little Help needed moving from lying on your back to sitting on the side of a flat bed without using bedrails?: A Little Help needed moving to and from a bed to a chair (including a wheelchair)?: A Little Help needed standing up from a chair using your arms (e.g., wheelchair or bedside chair)?: A Little Help needed to walk in hospital room?: A Little Help needed climbing 3-5 steps with a railing? : A Lot 6 Click Score: 17    End of Session Equipment Utilized During Treatment: Gait belt Activity Tolerance: Patient tolerated treatment well Patient left: Other (comment) (Pt in BR for BM with education to call nsg when ready, nursing aware) Nurse Communication: Mobility status PT Visit Diagnosis: Unsteadiness on feet (R26.81);History of falling (Z91.81);Difficulty in walking, not elsewhere classified (R26.2);Pain;Muscle weakness (generalized) (M62.81) Pain - Right/Left: Left Pain - part of body: Ankle and  joints of foot    Time: 1446-1541 PT Time Calculation (min) (ACUTE ONLY): 55 min   Charges:   PT Evaluation $PT Eval Moderate Complexity: 1 Mod PT Treatments $Gait Training: 8-22 mins        D. Royetta Asal PT, DPT 05/29/20, 4:08 PM

## 2020-05-29 NOTE — Plan of Care (Signed)

## 2020-05-30 LAB — CBC
HCT: 41.9 % (ref 39.0–52.0)
Hemoglobin: 14.5 g/dL (ref 13.0–17.0)
MCH: 32.4 pg (ref 26.0–34.0)
MCHC: 34.6 g/dL (ref 30.0–36.0)
MCV: 93.7 fL (ref 80.0–100.0)
Platelets: 165 10*3/uL (ref 150–400)
RBC: 4.47 MIL/uL (ref 4.22–5.81)
RDW: 13.2 % (ref 11.5–15.5)
WBC: 13.6 10*3/uL — ABNORMAL HIGH (ref 4.0–10.5)
nRBC: 0 % (ref 0.0–0.2)

## 2020-05-30 LAB — GLUCOSE, CAPILLARY
Glucose-Capillary: 181 mg/dL — ABNORMAL HIGH (ref 70–99)
Glucose-Capillary: 196 mg/dL — ABNORMAL HIGH (ref 70–99)

## 2020-05-30 LAB — BASIC METABOLIC PANEL
Anion gap: 10 (ref 5–15)
BUN: 19 mg/dL (ref 8–23)
CO2: 21 mmol/L — ABNORMAL LOW (ref 22–32)
Calcium: 8.5 mg/dL — ABNORMAL LOW (ref 8.9–10.3)
Chloride: 103 mmol/L (ref 98–111)
Creatinine, Ser: 0.68 mg/dL (ref 0.61–1.24)
GFR, Estimated: >60 mL/min (ref >60)
Glucose, Bld: 172 mg/dL — ABNORMAL HIGH (ref 70–99)
Potassium: 4.5 mmol/L (ref 3.5–5.1)
Sodium: 134 mmol/L — ABNORMAL LOW (ref 135–145)

## 2020-05-30 MED ORDER — CEFDINIR 300 MG PO CAPS: 300.0000 mg | ORAL_CAPSULE | Freq: Two times a day (BID) | ORAL | 0 refills | Status: DC

## 2020-05-30 MED ORDER — INSULIN GLARGINE 100 UNIT/ML ~~LOC~~ SOLN
12.0000 [IU] | Freq: Every day | SUBCUTANEOUS | Status: DC
  Filled 2020-05-30: qty 0.12

## 2020-05-30 MED ORDER — CEFDINIR 300 MG PO CAPS
300.0000 mg | ORAL_CAPSULE | Freq: Two times a day (BID) | ORAL | Status: DC
  Filled 2020-05-30: qty 1

## 2020-05-30 MED ORDER — CYANOCOBALAMIN 1000 MCG/ML IJ SOLN: 1000.0000 ug | INTRAMUSCULAR | 0 refills | Status: AC

## 2020-05-30 MED ORDER — METOPROLOL SUCCINATE ER 50 MG PO TB24: 50.0000 mg | ORAL_TABLET | Freq: Every day | ORAL | 1 refills | Status: AC

## 2020-05-30 MED ORDER — COLCHICINE 0.6 MG PO TABS: 0.6000 mg | ORAL_TABLET | Freq: Two times a day (BID) | ORAL | 0 refills | Status: AC

## 2020-05-30 NOTE — Progress Notes (Signed)
Physical Therapy Treatment Patient Details Name: Jordan Drake MRN: [id number] DOB: [birth year] Today's Date: 05/30/2020    History of Present Illness Pt is an 80 y.o. male with medical history significant for chronic heart failure with preserved EF, hypertension, type 2 diabetes mellitus, coronary artery disease status post CABG, admitted on 05/28/2020 with severe sepsis due to left lower extremity cellulitis complaining of left lower extremity erythema.  MD assessment includes: Severe sepsis secondary to left lower extremity cellulitis, L ankle pain, generalized weakness/history of falls with bilateral feet paresthesias, elevated troponin most likely secondary to demand ischemia, and hypomagnesemia.    PT Comments    Pt was pleasant and motivated to participate during the session and made very good progress towards goals. Pt was able to ambulate with improved gait quality progressing to step-through pattern with no buckling and with no reported L ankle pain.  Pt was steady ascending and descending 2 stairs with B rails with good eccentric and concentric control.  Pt education provided on energy conservation strategies, gait sequencing for maximum safety with the RW, car transfer sequencing, and stair sequencing all with son present.  Pt will benefit from HHPT upon discharge to safely address deficits listed in patient problem list for decreased caregiver assistance and eventual return to PLOF.      Follow Up Recommendations  Home health PT;Supervision for mobility/OOB     Equipment Recommendations  Other (comment) (Pt interested in shower chair)    Recommendations for Other Services       Precautions / Restrictions Precautions Precautions: Fall Restrictions Weight Bearing Restrictions: No    Mobility  Bed Mobility               General bed mobility comments: NT, pt in recliner    Transfers Overall transfer level: Needs assistance Equipment used: Rolling walker (2  wheeled) Transfers: Sit to/from Stand Sit to Stand: Supervision         General transfer comment: Good eccentric and concentric control and stability  Ambulation/Gait Ambulation/Gait assistance: Min guard Gait Distance (Feet): 30 Feet Assistive device: Rolling walker (2 wheeled) Gait Pattern/deviations: Step-to pattern;Decreased stance time - left;Decreased step length - right;Step-through pattern Gait velocity: decreased   General Gait Details: Pt able to amb with step-to pattern that gradually progressed to step-through pattern with slow cadence but steady without LOB or L knee buckling   Stairs Stairs: Yes Stairs assistance: Min guard Stair Management: Two rails;Step to pattern;Forwards;Backwards Number of Stairs: 2 General stair comments: Pt able to ascend/descend 1 step and then 2 steps, forwards ascending and backwards descending, with good control and stability with son present for training   Wheelchair Mobility    Modified Rankin (Stroke Patients Only)       Balance Overall balance assessment: Needs assistance Sitting-balance support: Feet supported Sitting balance-Leahy Scale: Good     Standing balance support: Bilateral upper extremity supported;During functional activity Standing balance-Leahy Scale: Good Standing balance comment: Use of the RW for support in standing but no LOB                            Cognition Arousal/Alertness: Awake/alert Behavior During Therapy: WFL for tasks assessed/performed Overall Cognitive Status: Within Functional Limits for tasks assessed  Exercises Other Exercises: Energy conservation education provided Other Exercises: Car transfer sequencing verbal education and visual simulated demonstration    General Comments        Pertinent Vitals/Pain Pain Assessment: No/denies pain    Home Living                      Prior Function             PT Goals (current goals can now be found in the care plan section) Progress towards PT goals: Progressing toward goals    Frequency    Min 2X/week      PT Plan Discharge plan needs to be updated    Co-evaluation              AM-PAC PT "6 Clicks" Mobility   Outcome Measure  Help needed turning from your back to your side while in a flat bed without using bedrails?: A Little Help needed moving from lying on your back to sitting on the side of a flat bed without using bedrails?: A Little Help needed moving to and from a bed to a chair (including a wheelchair)?: A Little Help needed standing up from a chair using your arms (e.g., wheelchair or bedside chair)?: A Little Help needed to walk in hospital room?: A Little Help needed climbing 3-5 steps with a railing? : A Little 6 Click Score: 18    End of Session Equipment Utilized During Treatment: Gait belt Activity Tolerance: Patient tolerated treatment well Patient left: in chair;with family/visitor present;with call bell/phone within reach Nurse Communication: Mobility status PT Visit Diagnosis: Unsteadiness on feet (R26.81);History of falling (Z91.81);Difficulty in walking, not elsewhere classified (R26.2);Pain;Muscle weakness (generalized) (M62.81) Pain - Right/Left: Left Pain - part of body: Ankle and joints of foot     Time: 5852-7782 PT Time Calculation (min) (ACUTE ONLY): 41 min  Charges:  $Gait Training: 23-37 mins $Therapeutic Activity: 8-22 mins                     D. Scott  PT, DPT 05/30/20, 4:36 PM

## 2020-05-30 NOTE — TOC Transition Note (Addendum)
Transition of Care [facility]) - CM/SW Discharge Note   Patient Details  Name: Jordan Drake MRN: [id number] Date of Birth: 01-21-1940  Transition of Care [facility]) CM/SW Contact:  Alberteen Sam, LCSW Phone Number: 05/30/2020, 3:16 PM   Clinical Narrative:     Patient reports they will use rolling walker at home, and that bariatric shower chair will not fit in their shower after Suanne Marker with [employer] showed an example that was measured by son. No further DME needs identified.   Patient agreeable to home health PT, OT and RN. Jason with [facility] able to accept. Patient and son at bedside informed.   No further discharge needs identified at this time. Son to transport home.   Final next level of care: [place] Barriers to Discharge: No Barriers Identified   Patient Goals and CMS Choice Patient states their goals for this hospitalization and ongoing recovery are:: to go home CMS Medicare.gov Compare Post Acute Care list provided to:: Patient Choice offered to / list presented to : Patient  Discharge Placement                  Name of family member notified: son at bedside Patient and family notified of of transfer: 05/30/20  Discharge Plan and Services                          HH Arranged: PT,OT [facility] Agency: [place] ([place]) Date [place]: 05/30/20 Time HH Agency Contacted: 1500 Representative spoke with at [place]: [place] Interventions     Readmission Risk Interventions No flowsheet data found.

## 2020-05-30 NOTE — Discharge Summary (Signed)
Physician Discharge Summary  Jordan Drake Ney IOM:[id number] DOB: 04-Sep-1940 DOA: 05/28/2020  PCP: Prince Solian, MD  Admit date: 05/28/2020 Discharge date: 05/30/2020  Admitted From: Home Disposition: Home  Recommendations for Outpatient Follow-up:  1. Follow up with PCP in 1-2 weeks 2. Please obtain BMP/CBC in one week 3. Please follow up on the following pending results: None  Home Health: Yes Equipment/Devices: Rolling walker Discharge Condition: Stable CODE STATUS: DNR Diet recommendation: Heart Healthy / Carb Modified   Brief/Interim Summary: Jordan Wahba Cableis a 80 y.o.malewith medical history significant forchronic heart failure with preserved EF, hypertension, type 2 diabetes mellitus, coronary artery disease status post CABG,Who is admitted to [facility] on5/16/2022with severe sepsis due to left lower extremity cellulitisafter presenting from home to [facility] ED complaining of left lower extremity erythema. Patient walks with the help of 2 canes and also had a mechanical fall before coming to the hospital.  No significant injury or fracture. He was found to have left lower extremity cellulitis and met criteria for sepsis with fever, leukocytosis, AKI and lactic acidosis.  AKI and lactic acidosis resolved.  He initially received IV ceftriaxone and discharged on 3 more days of cefdinir to complete the course.  Significant improvement in left lower extremity erythema and edema.  Uric acid levels were checked because of some concern of gout involving left ankle, although pain can be due to fall and muscular injury.  No prior diagnosis of gout.  Uric acid levels were markedly elevated at 8.9 and he was given some colchicine to help with the pain.  He was advised to follow-up with primary care provider to start him on uricosuric drugs once pain improved.  His vitamin B levels were checked because of his history of imbalance and bilateral feet paresthesias.  They  were low at 59.  Although patient has good animal protein intake.  He was started on IM B12 his primary care provider either can continue or try high-dose p.o. to keep the levels within normal limit.  Our physical therapist evaluated him and initially recommended rehab but patient opted to go home with home health services which were ordered.  Patient has an history of chronic HFpEF and there was some concern of worsening weight gain and dyspnea.  Appears euvolemic.  Repeat echocardiogram without any significant abnormality. He was also found to have first-degree heart block on EKG and his home dose of metoprolol was reduced and he was advised to follow-up with his cardiologist for further recommendations.  He was also found to have elevated troponin with history of CAD.  Most likely secondary to demand ischemia as there was a flat curve and no chest pain.  He will continue home dose of statin.  His home antihypertensives were initially held due to concern of sepsis and he can resume on discharge.  Patient has an history of type 2 diabetes mellitus and A1c of 7.2.  He will resume his home meds on discharge and follow-up with his primary care provider for further recommendations.  Patient has an history of class III obesity with BMI of above 42.  He was counseled and will get benefit with weight loss.  Patient will continue rest of his home medications and follow-up with his providers.  Discharge Diagnoses:  Principal Problem:   Cellulitis of left lower extremity Active Problems:   Severe sepsis (HCC)   Acute on chronic diastolic CHF (congestive heart failure) (HCC)   Elevated troponin   Generalized weakness   Lactic acidosis  Elevated CPK   Essential hypertension   Diabetes mellitus without complication [facility])   Discharge Instructions  Discharge Instructions    Call MD for:  persistant dizziness or light-headedness   Complete by: As directed    Call MD for:  redness, tenderness,  or signs of infection (pain, swelling, redness, odor or green/yellow discharge around incision site)   Complete by: As directed    Call MD for:  severe uncontrolled pain   Complete by: As directed    Call MD for:  temperature >100.4   Complete by: As directed    Diet - low sodium heart healthy   Complete by: As directed    Discharge instructions   Complete by: As directed    It was pleasure taking care of you. As we discussed your vitamin B12 levels are very low that can be contributory to your imbalance and falls, you are being given IM injections of B12, either a family member can do it or you can contact your primary care provider to have it done on a weekly basis. As we also discussed your uric acid level were high, I am giving you a pain medicine to help with your ankle pain as there might be an element of gout, discussed with your primary care provider and they can start you on medications to lower your uric acid level to prevent future problems. You are also being given 3 more days of antibiotics for your cellulitis, please take it as directed. We decreased the dose of metoprolol because of your heart rate, please start taking low-dose and follow-up closely with your cardiologist for further recommendations.   Increase activity slowly   Complete by: As directed      Allergies as of 05/30/2020   No Known Allergies     Medication List    TAKE these medications   amLODipine-benazepril 10-20 MG capsule Commonly known as: LOTREL Take 1 capsule by mouth daily.   aspirin EC 81 MG tablet Take 81 mg by mouth daily.   cefdinir 300 MG capsule Commonly known as: OMNICEF Take 1 capsule (300 mg total) by mouth every 12 (twelve) hours for 3 days.   colchicine 0.6 MG tablet Take 1 tablet (0.6 mg total) by mouth 2 (two) times daily.   cyanocobalamin 1000 MCG/ML injection Commonly known as: (VITAMIN B-12) Inject 1 mL (1,000 mcg total) into the muscle every 7 (seven) days.    glimepiride 1 MG tablet Commonly known as: AMARYL Take 1 mg by mouth every morning.   HYDROcodone-acetaminophen 5-325 MG tablet Commonly known as: NORCO/VICODIN Take 1 tablet by mouth every 6 (six) hours as needed for moderate pain.   metFORMIN 500 MG tablet Commonly known as: GLUCOPHAGE Take 500 mg by mouth 2 (two) times daily with a meal.   metoprolol succinate 50 MG 24 hr tablet Commonly known as: TOPROL-XL Take 1 tablet (50 mg total) by mouth daily. Take with or immediately following a meal. Start taking on: May 31, 2020 What changed:   medication strength  how much to take  additional instructions   nitroGLYCERIN 0.4 MG SL tablet Commonly known as: NITROSTAT Place 0.4 mg under the tongue every 5 (five) minutes as needed for chest pain.   simvastatin 20 MG tablet Commonly known as: ZOCOR Take 20 mg by mouth daily.            Durable Medical Equipment  (From admission, onward)         Start     Ordered  05/30/20 1505  For home use only DME Walker wide  Once       Question:  Patient needs a walker to treat with the following condition  Answer:  Generalized weakness   05/30/20 1505   05/30/20 1505  For home use only DME Shower stool  Once       Comments: Will need bariatric stool   05/30/20 1505          Follow-up Information    Avva, Ravisankar, MD. Schedule an appointment as soon as possible for a visit.   Specialty: Internal Medicine Contact information: [address] [place] [address] [phone]              No Known Allergies  Consultations:  None  Procedures/Studies: DG Ankle 2 Views Left  Result Date: 05/28/2020 CLINICAL DATA:  Status post fall. EXAM: LEFT ANKLE - 2 VIEW COMPARISON:  None. FINDINGS: There is no evidence of an acute fracture, dislocation, or joint effusion. Small chronic changes are seen involving the left lateral malleolus and anterior aspect of the distal right tibia. There is a small to moderate sized  plantar calcaneal spur. Diffuse soft tissue swelling is seen which is likely secondary to the patient's body habitus. Diffuse distortion of the subcutaneous fat is seen. Marked severity vascular calcification is noted. IMPRESSION: 1. Small chronic changes without an acute osseous abnormality. 2. Additional findings consistent with diffuse cellulitis. Electronically Signed   By: Virgina Norfolk M.D.   On: 05/28/2020 17:57   US Venous Img Lower Unilateral Left  Result Date: 05/28/2020 CLINICAL DATA:  80 year old with left lower extremity swelling. EXAM: LEFT LOWER EXTREMITY VENOUS DOPPLER ULTRASOUND TECHNIQUE: Gray-scale sonography with graded compression, as well as color Doppler and duplex ultrasound were performed to evaluate the lower extremity deep venous systems from the level of the common femoral vein and including the common femoral, femoral, profunda femoral, popliteal and calf veins including the posterior tibial, peroneal and gastrocnemius veins when visible. The superficial great saphenous vein was also interrogated. Spectral Doppler was utilized to evaluate flow at rest and with distal augmentation maneuvers in the common femoral, femoral and popliteal veins. COMPARISON:  None. FINDINGS: Contralateral Common Femoral Vein: Respiratory phasicity is normal and symmetric with the symptomatic side. No evidence of thrombus. Normal compressibility. Common Femoral Vein: No evidence of thrombus. Normal compressibility, respiratory phasicity and response to augmentation. Saphenofemoral Junction: No evidence of thrombus. Normal compressibility and flow on color Doppler imaging. Profunda Femoral Vein: No evidence of thrombus. Normal compressibility and flow on color Doppler imaging. Femoral Vein: No evidence of thrombus. Normal compressibility, respiratory phasicity and response to augmentation. Popliteal Vein: No evidence of thrombus. Normal compressibility, respiratory phasicity and response to augmentation.  Calf Veins: Visualized left deep calf veins are patent without thrombus. Limited evaluation. Other Findings:  None. IMPRESSION: Negative for deep venous thrombosis in left lower extremity. Limited evaluation of the left deep calf veins. Electronically Signed   By: Markus Daft M.D.   On: 05/28/2020 18:20   DG Chest Portable 1 View  Result Date: 05/28/2020 CLINICAL DATA:  Status post fall. EXAM: PORTABLE CHEST 1 VIEW COMPARISON:  None. FINDINGS: Multiple sternal wires and vascular clips are noted. Mild to moderate severity diffusely increased interstitial lung markings are seen. There is no evidence of focal consolidation, pleural effusion or pneumothorax. The cardiac silhouette is mildly enlarged. The visualized skeletal structures are unremarkable. IMPRESSION: 1. Evidence of prior median sternotomy/CABG. 2. Cardiomegaly with increased interstitial lung markings which are likely,  in part, chronic in nature. A superimposed component of interstitial edema cannot be excluded. Electronically Signed   By: Virgina Norfolk M.D.   On: 05/28/2020 17:51   DG Foot 2 Views Left  Result Date: 05/28/2020 CLINICAL DATA:  Status post fall. EXAM: LEFT FOOT - 2 VIEW COMPARISON:  None. FINDINGS: A fracture deformity of indeterminate age is seen involving the proximal phalanx of the fifth right toe. There is no evidence of dislocation. Mild chronic changes are seen along the anterior aspect of the distal right tibia. Marked severity diffuse soft tissue swelling is seen with diffuse distortion of the subcutaneous fat throughout the right foot and right ankle. IMPRESSION: 1. Fracture of the proximal phalanx of the fifth right toe of indeterminate age. 2. Additional findings consistent with diffuse cellulitis. Electronically Signed   By: Virgina Norfolk M.D.   On: 05/28/2020 18:12   ECHOCARDIOGRAM COMPLETE  Result Date: 05/29/2020    ECHOCARDIOGRAM REPORT   Patient Name:   EDGARDO PETRENKO Kalt Date of Exam: 05/29/2020 Medical Rec  #:  [id number]      Height:       69.0 in Accession #:    [id number]     Weight:       294.2 lb Date of Birth:  1940-07-11      BSA:          2.434 m Patient Age:    89 years       BP:           144/58 mmHg Patient Gender: M              HR:           73 bpm. Exam Location:  [facility] Procedure: 2D Echo, Cardiac Doppler and Color Doppler Indications:     CHF-Acute diastolic 69.67  History:         Patient has no prior history of Echocardiogram examinations.                  CAD; Risk Factors:Hypertension and Diabetes.  Sonographer:     Sherrie Sport RDCS (AE) Referring Phys:  [id number] Rhetta Mura Diagnosing Phys: Nelva Bush MD IMPRESSIONS  1. Left ventricular ejection fraction, by estimation, is 55 to 60%. The left ventricle has normal function. Left ventricular endocardial border not optimally defined to evaluate regional wall motion. There is mild left ventricular hypertrophy. Left ventricular diastolic parameters are consistent with Grade II diastolic dysfunction (pseudonormalization). Elevated left atrial pressure.  2. Pulmonary artery pressure is mildly to moderately elevated (PASP 35-40 mmHg plus central venous pressure). Right ventricular systolic function is normal. The right ventricular size is mildly enlarged.  3. Left atrial size was moderately dilated.  4. Right atrial size was moderately dilated.  5. The mitral valve is degenerative. Mild to moderate mitral valve regurgitation. No evidence of mitral stenosis.  6. The aortic valve has an indeterminant number of cusps. Aortic valve regurgitation is not visualized. No aortic stenosis is present. FINDINGS  Left Ventricle: Left ventricular ejection fraction, by estimation, is 55 to 60%. The left ventricle has normal function. Left ventricular endocardial border not optimally defined to evaluate regional wall motion. The left ventricular internal cavity size was normal in size. There is mild left ventricular hypertrophy. Left ventricular diastolic parameters  are consistent with Grade II diastolic dysfunction (pseudonormalization). Elevated left atrial pressure. Right Ventricle: Pulmonary artery pressure is mildly to moderately elevated (PASP 35-40 mmHg plus central venous pressure). The right ventricular size is  mildly enlarged. Right vetricular wall thickness was not well visualized. Right ventricular systolic  function is normal. Left Atrium: Left atrial size was moderately dilated. Right Atrium: Right atrial size was moderately dilated. Pericardium: The pericardium was not well visualized. Mitral Valve: The mitral valve is degenerative in appearance. There is mild thickening of the mitral valve leaflet(s). Mild mitral annular calcification. Mild to moderate mitral valve regurgitation. No evidence of mitral valve stenosis. Tricuspid Valve: The tricuspid valve is not well visualized. Tricuspid valve regurgitation is mild. Aortic Valve: The aortic valve has an indeterminant number of cusps. Aortic valve regurgitation is not visualized. No aortic stenosis is present. Aortic valve mean gradient measures 4.5 mmHg. Aortic valve peak gradient measures 9.2 mmHg. Aortic valve area, by VTI measures 2.61 cm. Pulmonic Valve: The pulmonic valve was not well visualized. Pulmonic valve regurgitation is not visualized. No evidence of pulmonic stenosis. Aorta: The aortic root is normal in size and structure. Pulmonary Artery: The pulmonary artery is not well seen. Venous: The inferior vena cava was not well visualized. IAS/Shunts: The interatrial septum was not well visualized.  LEFT VENTRICLE PLAX 2D LVIDd:         3.82 cm  Diastology LVIDs:         2.54 cm  LV e' medial:    7.07 cm/s LV PW:         1.25 cm  LV E/e' medial:  16.1 LV IVS:        1.36 cm  LV e' lateral:   7.94 cm/s LVOT diam:     2.00 cm  LV E/e' lateral: 14.4 LV SV:         70 LV SV Index:   29 LVOT Area:     3.14 cm  RIGHT VENTRICLE RV Basal diam:  4.43 cm RV S prime:     8.59 cm/s TAPSE (M-mode): 1.9 cm LEFT  ATRIUM              Index       RIGHT ATRIUM           Index LA diam:        4.70 cm  1.93 cm/m  RA Area:     27.70 cm LA Vol (A2C):   104.0 ml 42.72 ml/m RA Volume:   90.20 ml  37.05 ml/m LA Vol (A4C):   113.0 ml 46.42 ml/m LA Biplane Vol: 112.0 ml 46.01 ml/m  AORTIC VALVE                    PULMONIC VALVE AV Area (Vmax):    2.58 cm     PV Vmax:        0.53 m/s AV Area (Vmean):   2.34 cm     PV Peak grad:   1.1 mmHg AV Area (VTI):     2.61 cm     RVOT Peak grad: 1 mmHg AV Vmax:           152.00 cm/s AV Vmean:          100.750 cm/s AV VTI:            0.268 m AV Peak Grad:      9.2 mmHg AV Mean Grad:      4.5 mmHg LVOT Vmax:         125.00 cm/s LVOT Vmean:        75.000 cm/s LVOT VTI:          0.223 m LVOT/AV VTI ratio:  0.83  AORTA Ao Root diam: 3.30 cm MITRAL VALVE                TRICUSPID VALVE MV Area (PHT): 4.17 cm     TR Peak grad:   36.5 mmHg MV Decel Time: 182 msec     TR Vmax:        302.00 cm/s MV E velocity: 114.00 cm/s MV A velocity: 92.10 cm/s   SHUNTS MV E/A ratio:  1.24         Systemic VTI:  0.22 m                             Systemic Diam: 2.00 cm Nelva Bush MD Electronically signed by Nelva Bush MD Signature Date/Time: 05/29/2020/2:49:19 PM    Final      Subjective: Patient was seen and examined today.  Had different questions regarding falls, ankle pain and cellulitis.  All questions are answered to the best of my ability.  Son at bedside.  Discharge Exam: Vitals:   05/30/20 0842 05/30/20 1256  BP: 125/72 (!) 134/53  Pulse: 64 (!) 57  Resp: 18 18  Temp: 97.7 F (36.5 C) 97.8 F (36.6 C)  SpO2: 97% 98%   Vitals:   05/29/20 2043 05/30/20 0403 05/30/20 0842 05/30/20 1256  BP: (!) 147/78 (!) 144/75 125/72 (!) 134/53  Pulse: 92 73 64 (!) 57  Resp: '18 20 18 18  ' Temp: 98.4 F (36.9 C) 98.5 F (36.9 C) 97.7 F (36.5 C) 97.8 F (36.6 C)  TempSrc: Oral Oral    SpO2: 97% 97% 97% 98%  Weight:   131.8 kg   Height:        General: Pt is alert, awake, not in  acute distress Cardiovascular: RRR, S1/S2 +, no rubs, no gallops Respiratory: CTA bilaterally, no wheezing, no rhonchi Abdominal: Soft, NT, ND, bowel sounds + Extremities: Trace left lower extremity edema and revolving erythema, no cyanosis   The results of significant diagnostics from this hospitalization (including imaging, microbiology, ancillary and laboratory) are listed below for reference.    Microbiology: Recent Results (from the past 240 hour(s))  Resp Panel by RT-PCR (Flu A&B, Covid) Nasopharyngeal Swab     Status: None   Collection Time: 05/28/20  4:58 PM   Specimen: Nasopharyngeal Swab; Nasopharyngeal(NP) swabs in vial transport medium  Result Value Ref Range Status   SARS Coronavirus 2 by RT PCR NEGATIVE NEGATIVE Final    Comment: (NOTE) SARS-CoV-2 target nucleic acids are NOT DETECTED.  The SARS-CoV-2 RNA is generally detectable in upper respiratory specimens during the acute phase of infection. The lowest concentration of SARS-CoV-2 viral copies this assay can detect is 138 copies/mL. A negative result does not preclude SARS-Cov-2 infection and should not be used as the sole basis for treatment or other patient management decisions. A negative result may occur with  improper specimen collection/handling, submission of specimen other than nasopharyngeal swab, presence of viral mutation(s) within the areas targeted by this assay, and inadequate number of viral copies(<138 copies/mL). A negative result must be combined with clinical observations, patient history, and epidemiological information. The expected result is Negative.  Fact Sheet for Patients:  EntrepreneurPulse.com.au  Fact Sheet for Healthcare Providers:  IncredibleEmployment.be  This test is no t yet approved or cleared by the [place] FDA and  has been authorized for detection and/or diagnosis of SARS-CoV-2 by FDA under an Emergency Use Authorization (EUA). This  EUA will  remain  in effect (meaning this test can be used) for the duration of the COVID-19 declaration under Section 564(b)(1) of the Act, 21 U.S.C.section 360bbb-3(b)(1), unless the authorization is terminated  or revoked sooner.       Influenza A by PCR NEGATIVE NEGATIVE Final   Influenza B by PCR NEGATIVE NEGATIVE Final    Comment: (NOTE) The Xpert Xpress SARS-CoV-2/FLU/RSV plus assay is intended as an aid in the diagnosis of influenza from Nasopharyngeal swab specimens and should not be used as a sole basis for treatment. Nasal washings and aspirates are unacceptable for Xpert Xpress SARS-CoV-2/FLU/RSV testing.  Fact Sheet for Patients: EntrepreneurPulse.com.au  Fact Sheet for Healthcare Providers: IncredibleEmployment.be  This test is not yet approved or cleared by the [place] FDA and has been authorized for detection and/or diagnosis of SARS-CoV-2 by FDA under an Emergency Use Authorization (EUA). This EUA will remain in effect (meaning this test can be used) for the duration of the COVID-19 declaration under Section 564(b)(1) of the Act, 21 U.S.C. section 360bbb-3(b)(1), unless the authorization is terminated or revoked.  Performed at [facility], [address] [place], [place] [address]   Culture, blood (Routine X 2) w Reflex to ID Panel     Status: None (Preliminary result)   Collection Time: 05/28/20  8:01 PM   Specimen: BLOOD  Result Value Ref Range Status   Specimen Description BLOOD BLOOD RIGHT HAND  Final   Special Requests   Final    BOTTLES DRAWN AEROBIC AND ANAEROBIC Blood Culture results may not be optimal due to an inadequate volume of blood received in culture bottles   Culture   Final    NO GROWTH < 24 HOURS Performed at [facility], [address], [place], [place] [address]    Report Status PENDING  Incomplete  Culture, blood (Routine X 2) w Reflex to ID Panel     Status: None  (Preliminary result)   Collection Time: 05/28/20  8:14 PM   Specimen: BLOOD  Result Value Ref Range Status   Specimen Description BLOOD BLOOD LEFT HAND  Final   Special Requests   Final    BOTTLES DRAWN AEROBIC AND ANAEROBIC Blood Culture results may not be optimal due to an inadequate volume of blood received in culture bottles   Culture   Final    NO GROWTH < 24 HOURS Performed at [facility], [address], [place], [place] [address]    Report Status PENDING  Incomplete  MRSA PCR Screening     Status: None   Collection Time: 05/29/20  4:23 PM   Specimen: Nasopharyngeal  Result Value Ref Range Status   MRSA by PCR NEGATIVE NEGATIVE Final    Comment:        The GeneXpert MRSA Assay (FDA approved for NASAL specimens only), is one component of a comprehensive MRSA colonization surveillance program. It is not intended to diagnose MRSA infection nor to guide or monitor treatment for MRSA infections. Performed at [facility], [address] [place], [place] [address]      Labs: BNP (last 3 results) Recent Labs    05/28/20 1722  BNP 754.4*   Basic Metabolic Panel: Recent Labs  Lab 05/28/20 1721 05/29/20 0545 05/30/20 0558  NA 139 137 134*  K 4.7 4.3 4.5  CL 103 102 103  CO2 22 24 21*  GLUCOSE 226* 142* 172*  BUN 27* 27* 19  CREATININE 1.38* 1.02 0.68  CALCIUM 8.9 8.4* 8.5*  MG 1.6* 2.1  --   PHOS  --  3.8  --    Liver Function Tests: Recent Labs  Lab 05/28/20 1721 05/29/20 0545  AST 67* 78*  ALT 32 30  ALKPHOS 37* 36*  BILITOT 1.0 1.0  PROT 7.3 6.8  ALBUMIN 3.7 3.3*   No results for input(s): LIPASE, AMYLASE in the last 168 hours. No results for input(s): AMMONIA in the last 168 hours. CBC: Recent Labs  Lab 05/28/20 1721 05/29/20 0545 05/30/20 0558  WBC 23.3* 18.1* 13.6*  NEUTROABS 20.8* 14.3*  --   HGB 14.5 14.3 14.5  HCT 42.9 41.4 41.9  MCV 95.3 94.5 93.7  PLT 175 158 165   Cardiac Enzymes: Recent Labs  Lab  05/28/20 2005  CKTOTAL 1,886*   BNP: Invalid input(s): POCBNP CBG: Recent Labs  Lab 05/29/20 1231 05/29/20 1636 05/29/20 2045 05/30/20 0842 05/30/20 1253  GLUCAP 179* 146* 154* 181* 196*   D-Dimer No results for input(s): DDIMER in the last 72 hours. Hgb A1c Recent Labs    05/28/20 2001  HGBA1C 7.2*   Lipid Profile No results for input(s): CHOL, HDL, LDLCALC, TRIG, CHOLHDL, LDLDIRECT in the last 72 hours. Thyroid function studies Recent Labs    05/28/20 2001  TSH 0.978   Anemia work up Recent Labs    05/29/20 0545  VITAMINB12 59*   Urinalysis    Component Value Date/Time   COLORURINE YELLOW (A) 05/28/2020 2059   APPEARANCEUR HAZY (A) 05/28/2020 2059   LABSPEC 1.015 05/28/2020 2059   PHURINE 5.0 05/28/2020 2059   GLUCOSEU 50 (A) 05/28/2020 2059   HGBUR SMALL (A) 05/28/2020 2059   BILIRUBINUR NEGATIVE 05/28/2020 2059   KETONESUR 5 (A) 05/28/2020 2059   PROTEINUR 30 (A) 05/28/2020 2059   NITRITE NEGATIVE 05/28/2020 2059   LEUKOCYTESUR NEGATIVE 05/28/2020 2059   Sepsis Labs Invalid input(s): PROCALCITONIN,  WBC,  [facility] Recent Results (from the past 240 hour(s))  Resp Panel by RT-PCR (Flu A&B, Covid) Nasopharyngeal Swab     Status: None   Collection Time: 05/28/20  4:58 PM   Specimen: Nasopharyngeal Swab; Nasopharyngeal(NP) swabs in vial transport medium  Result Value Ref Range Status   SARS Coronavirus 2 by RT PCR NEGATIVE NEGATIVE Final    Comment: (NOTE) SARS-CoV-2 target nucleic acids are NOT DETECTED.  The SARS-CoV-2 RNA is generally detectable in upper respiratory specimens during the acute phase of infection. The lowest concentration of SARS-CoV-2 viral copies this assay can detect is 138 copies/mL. A negative result does not preclude SARS-Cov-2 infection and should not be used as the sole basis for treatment or other patient management decisions. A negative result may occur with  improper specimen collection/handling,  submission of specimen other than nasopharyngeal swab, presence of viral mutation(s) within the areas targeted by this assay, and inadequate number of viral copies(<138 copies/mL). A negative result must be combined with clinical observations, patient history, and epidemiological information. The expected result is Negative.  Fact Sheet for Patients:  EntrepreneurPulse.com.au  Fact Sheet for Healthcare Providers:  IncredibleEmployment.be  This test is no t yet approved or cleared by the [place] FDA and  has been authorized for detection and/or diagnosis of SARS-CoV-2 by FDA under an Emergency Use Authorization (EUA). This EUA will remain  in effect (meaning this test can be used) for the duration of the COVID-19 declaration under Section 564(b)(1) of the Act, 21 U.S.C.section 360bbb-3(b)(1), unless the authorization is terminated  or revoked sooner.       Influenza  A by PCR NEGATIVE NEGATIVE Final   Influenza B by PCR NEGATIVE NEGATIVE Final    Comment: (NOTE) The Xpert Xpress SARS-CoV-2/FLU/RSV plus assay is intended as an aid in the diagnosis of influenza from Nasopharyngeal swab specimens and should not be used as a sole basis for treatment. Nasal washings and aspirates are unacceptable for Xpert Xpress SARS-CoV-2/FLU/RSV testing.  Fact Sheet for Patients: EntrepreneurPulse.com.au  Fact Sheet for Healthcare Providers: IncredibleEmployment.be  This test is not yet approved or cleared by the [place] FDA and has been authorized for detection and/or diagnosis of SARS-CoV-2 by FDA under an Emergency Use Authorization (EUA). This EUA will remain in effect (meaning this test can be used) for the duration of the COVID-19 declaration under Section 564(b)(1) of the Act, 21 U.S.C. section 360bbb-3(b)(1), unless the authorization is terminated or revoked.  Performed at [facility], [address] [place], [place] [address]   Culture, blood (Routine X 2) w Reflex to ID Panel     Status: None (Preliminary result)   Collection Time: 05/28/20  8:01 PM   Specimen: BLOOD  Result Value Ref Range Status   Specimen Description BLOOD BLOOD RIGHT HAND  Final   Special Requests   Final    BOTTLES DRAWN AEROBIC AND ANAEROBIC Blood Culture results may not be optimal due to an inadequate volume of blood received in culture bottles   Culture   Final    NO GROWTH < 24 HOURS Performed at [facility], [address], [place], [place] [address]    Report Status PENDING  Incomplete  Culture, blood (Routine X 2) w Reflex to ID Panel     Status: None (Preliminary result)   Collection Time: 05/28/20  8:14 PM   Specimen: BLOOD  Result Value Ref Range Status   Specimen Description BLOOD BLOOD LEFT HAND  Final   Special Requests   Final    BOTTLES DRAWN AEROBIC AND ANAEROBIC Blood Culture results may not be optimal due to an inadequate volume of blood received in culture bottles   Culture   Final    NO GROWTH < 24 HOURS Performed at [facility], [address] [place], [place] [address]    Report Status PENDING  Incomplete  MRSA PCR Screening     Status: None   Collection Time: 05/29/20  4:23 PM   Specimen: Nasopharyngeal  Result Value Ref Range Status   MRSA by PCR NEGATIVE NEGATIVE Final    Comment:        The GeneXpert MRSA Assay (FDA approved for NASAL specimens only), is one component of a comprehensive MRSA colonization surveillance program. It is not intended to diagnose MRSA infection nor to guide or monitor treatment for MRSA infections. Performed at [facility], [address], [place], [place] [address]     Time coordinating discharge: Over 30 minutes  SIGNED:  Lorella Nimrod, MD  [facility] 05/30/2020, 4:02 PM  If 7PM-7AM, please contact night-coverage [email or web address]  This record has been created using Therapist, sports. Errors have been sought and corrected,but may not always be located. Such creation errors do not reflect on the standard of care.

## 2020-05-30 NOTE — Plan of Care (Signed)

## 2020-05-30 NOTE — TOC Initial Note (Signed)
Transition of Care [facility]) - Initial/Assessment Note    Patient Details  Name: Jordan Drake MRN: [id number] Date of Birth: Nov 05, 1940  Transition [facility]) CM/SW Contact:    Alberteen Sam, LCSW Phone Number: 05/30/2020, 9:42 AM  Clinical Narrative:                  CSW met with patient and son at bedside. They report goal is for patient to go home. CSW explained current SNF recommendation made by PT and OT due to patient's work with them yesterday. They report understanding however would like to be re assessed today to determine if he would be a safe discharge home. If not, are open to [place] facilities. Patient and son report if he is able to go home they would be interested in home health services. They report home is currently safe for patient in that patient's son has installed hand rails throughout the home, they have a walker and shower bench and only 2 steps to get into the home. Report the rest of the home is one level.   CSW has reached out to PT and OT to see if they can assess today to determine of home will be a safe discharge plan for patient.   Expected Discharge Plan:  (TBD) Barriers to Discharge: Continued Medical Work up   Patient Goals and CMS Choice Patient states their goals for this hospitalization and ongoing recovery are:: to go home CMS Medicare.gov Compare Post Acute Care list provided to:: Patient Choice offered to / list presented to : Patient,Adult Children (son at bedside)  Expected Discharge Plan and Services Expected Discharge Plan:  (TBD)       Living arrangements for the past 2 months: Single Family Home                                      Prior Living Arrangements/Services Living arrangements for the past 2 months: Single Family Home Lives with:: Self,Other (Comment) (has son support)   Do you feel safe going back to the place where you live?: Yes      Need for Family Participation in Patient Care: Yes  (Comment) Care giver support system in place?: Yes (comment)   Criminal Activity/Legal Involvement Pertinent to Current Situation/Hospitalization: No - Comment as needed  Activities of Daily Living Home Assistive Devices/Equipment: Grab bars in shower,Cane (specify quad or straight),Eyeglasses,CBG Meter ADL Screening (condition at time of admission) Patient's cognitive ability adequate to safely complete daily activities?: Yes Is the patient deaf or have difficulty hearing?: No Does the patient have difficulty seeing, even when wearing glasses/contacts?: No Does the patient have difficulty concentrating, remembering, or making decisions?: No Patient able to express need for assistance with ADLs?: Yes Does the patient have difficulty dressing or bathing?: Yes Independently performs ADLs?: No Communication: Independent Dressing (OT): Needs assistance Is this a change from baseline?: Change from baseline, expected to last <3days Grooming: Needs assistance Is this a change from baseline?: Change from baseline, expected to last <3 days Feeding: Independent Bathing: Needs assistance Is this a change from baseline?: Change from baseline, expected to last <3 days Toileting: Needs assistance Is this a change from baseline?: Change from baseline, expected to last <3 days In/Out Bed: Dependent Is this a change from baseline?: Change from baseline, expected to last <3 days Walks in Home: Independent with device (comment) Does the patient have difficulty  walking or climbing stairs?: Yes Weakness of Legs: Both Weakness of Arms/Hands: None  Permission Sought/Granted                  Emotional Assessment Appearance:: Appears stated age   Affect (typically observed): Accepting,Calm Orientation: : Oriented to Self,Oriented to Place,Oriented to  Time,Oriented to Situation Alcohol / Substance Use: Not Applicable Psych Involvement: No (comment)  Admission diagnosis:  Cellulitis of left  lower extremity [L03.116] Fall, initial encounter [W19.XXXA] Patient Active Problem List   Diagnosis Date Noted  . Severe sepsis ([place]) 05/29/2020  . Acute on chronic diastolic CHF (congestive heart failure) ([place]) 05/29/2020  . Elevated troponin 05/29/2020  . Generalized weakness 05/29/2020  . Lactic acidosis 05/29/2020  . Elevated CPK 05/29/2020  . Essential hypertension 05/29/2020  . Diabetes mellitus without complication ([place])   . Cellulitis of left lower extremity 05/28/2020  . Diabetic neuropathy ([place]) 08/25/2018   PCP:  Prince Solian, MD Pharmacy:   [place], [place] [facility] [address] [place] [address] Phone: [phone] Fax: [phone]     Social Determinants of Health (SDOH) Interventions    Readmission Risk Interventions No flowsheet data found.

## 2020-05-30 NOTE — Progress Notes (Signed)
Discharge paperwork reviewed and DC packet left with pt. All questions answered. Pt taken to vehicle via wheelchair.

## 2020-05-31 DIAGNOSIS — I5033 Acute on chronic diastolic (congestive) heart failure: Secondary | ICD-10-CM | POA: Diagnosis not present

## 2020-05-31 DIAGNOSIS — Z87891 Personal history of nicotine dependence: Secondary | ICD-10-CM | POA: Diagnosis not present

## 2020-05-31 DIAGNOSIS — I2510 Atherosclerotic heart disease of native coronary artery without angina pectoris: Secondary | ICD-10-CM | POA: Diagnosis not present

## 2020-05-31 DIAGNOSIS — Z7984 Long term (current) use of oral hypoglycemic drugs: Secondary | ICD-10-CM | POA: Diagnosis not present

## 2020-05-31 DIAGNOSIS — Z79891 Long term (current) use of opiate analgesic: Secondary | ICD-10-CM | POA: Diagnosis not present

## 2020-05-31 DIAGNOSIS — I110 Hypertensive heart disease with heart failure: Secondary | ICD-10-CM | POA: Diagnosis not present

## 2020-05-31 DIAGNOSIS — Z955 Presence of coronary angioplasty implant and graft: Secondary | ICD-10-CM | POA: Diagnosis not present

## 2020-05-31 DIAGNOSIS — Z794 Long term (current) use of insulin: Secondary | ICD-10-CM | POA: Diagnosis not present

## 2020-05-31 DIAGNOSIS — E119 Type 2 diabetes mellitus without complications: Secondary | ICD-10-CM | POA: Diagnosis not present

## 2020-05-31 DIAGNOSIS — Z9181 History of falling: Secondary | ICD-10-CM | POA: Diagnosis not present

## 2020-05-31 DIAGNOSIS — Z79899 Other long term (current) drug therapy: Secondary | ICD-10-CM | POA: Diagnosis not present

## 2020-05-31 DIAGNOSIS — L03116 Cellulitis of left lower limb: Secondary | ICD-10-CM | POA: Diagnosis not present

## 2020-06-01 ENCOUNTER — Inpatient Hospital Stay
Admission: EM | Admit: 2020-06-01 | Discharge: 2020-06-04 | DRG: 603 | Disposition: A | Discharge status: Home / Self Care | Payer: PPO | Attending: Internal Medicine | Admitting: Internal Medicine

## 2020-06-01 ENCOUNTER — Emergency Department: Payer: PPO

## 2020-06-01 ENCOUNTER — Other Ambulatory Visit: Payer: Self-pay

## 2020-06-01 ENCOUNTER — Encounter: Payer: Self-pay | Admitting: Emergency Medicine

## 2020-06-01 DIAGNOSIS — Z79899 Other long term (current) drug therapy: Secondary | ICD-10-CM | POA: Diagnosis not present

## 2020-06-01 DIAGNOSIS — I110 Hypertensive heart disease with heart failure: Secondary | ICD-10-CM | POA: Diagnosis present

## 2020-06-01 DIAGNOSIS — I10 Essential (primary) hypertension: Secondary | ICD-10-CM | POA: Diagnosis present

## 2020-06-01 DIAGNOSIS — Z7984 Long term (current) use of oral hypoglycemic drugs: Secondary | ICD-10-CM | POA: Diagnosis not present

## 2020-06-01 DIAGNOSIS — Z955 Presence of coronary angioplasty implant and graft: Secondary | ICD-10-CM | POA: Diagnosis not present

## 2020-06-01 DIAGNOSIS — Z20822 Contact with and (suspected) exposure to covid-19: Secondary | ICD-10-CM | POA: Diagnosis present

## 2020-06-01 DIAGNOSIS — Z87891 Personal history of nicotine dependence: Secondary | ICD-10-CM | POA: Diagnosis not present

## 2020-06-01 DIAGNOSIS — L03116 Cellulitis of left lower limb: Secondary | ICD-10-CM | POA: Diagnosis not present

## 2020-06-01 DIAGNOSIS — E872 Acidosis, unspecified: Secondary | ICD-10-CM | POA: Diagnosis present

## 2020-06-01 DIAGNOSIS — Z951 Presence of aortocoronary bypass graft: Secondary | ICD-10-CM

## 2020-06-01 DIAGNOSIS — I5032 Chronic diastolic (congestive) heart failure: Secondary | ICD-10-CM | POA: Diagnosis not present

## 2020-06-01 DIAGNOSIS — I2510 Atherosclerotic heart disease of native coronary artery without angina pectoris: Secondary | ICD-10-CM | POA: Diagnosis present

## 2020-06-01 DIAGNOSIS — M7989 Other specified soft tissue disorders: Secondary | ICD-10-CM | POA: Diagnosis not present

## 2020-06-01 DIAGNOSIS — E119 Type 2 diabetes mellitus without complications: Secondary | ICD-10-CM | POA: Diagnosis not present

## 2020-06-01 DIAGNOSIS — Z7982 Long term (current) use of aspirin: Secondary | ICD-10-CM | POA: Diagnosis not present

## 2020-06-01 DIAGNOSIS — Z66 Do not resuscitate: Secondary | ICD-10-CM | POA: Diagnosis not present

## 2020-06-01 DIAGNOSIS — T383X5A Adverse effect of insulin and oral hypoglycemic [antidiabetic] drugs, initial encounter: Secondary | ICD-10-CM | POA: Diagnosis present

## 2020-06-01 DIAGNOSIS — M109 Gout, unspecified: Secondary | ICD-10-CM | POA: Diagnosis present

## 2020-06-01 DIAGNOSIS — M19072 Primary osteoarthritis, left ankle and foot: Secondary | ICD-10-CM | POA: Diagnosis not present

## 2020-06-01 DIAGNOSIS — E785 Hyperlipidemia, unspecified: Secondary | ICD-10-CM | POA: Diagnosis present

## 2020-06-01 LAB — CBC WITH DIFFERENTIAL/PLATELET
Abs Immature Granulocytes: 0.62 10*3/uL — ABNORMAL HIGH (ref 0.00–0.07)
Basophils Absolute: 0.2 10*3/uL — ABNORMAL HIGH (ref 0.0–0.1)
Basophils Relative: 2 %
Eosinophils Absolute: 0.3 10*3/uL (ref 0.0–0.5)
Eosinophils Relative: 3 %
HCT: 44.4 % (ref 39.0–52.0)
Hemoglobin: 15.2 g/dL (ref 13.0–17.0)
Immature Granulocytes: 6 %
Lymphocytes Relative: 21 %
Lymphs Abs: 2.3 10*3/uL (ref 0.7–4.0)
MCH: 31.7 pg (ref 26.0–34.0)
MCHC: 34.2 g/dL (ref 30.0–36.0)
MCV: 92.5 fL (ref 80.0–100.0)
Monocytes Absolute: 0.9 10*3/uL (ref 0.1–1.0)
Monocytes Relative: 8 %
Neutro Abs: 6.7 10*3/uL (ref 1.7–7.7)
Neutrophils Relative %: 60 %
Platelets: 232 10*3/uL (ref 150–400)
RBC: 4.8 MIL/uL (ref 4.22–5.81)
RDW: 12.7 % (ref 11.5–15.5)
Smear Review: NORMAL
WBC: 11.1 10*3/uL — ABNORMAL HIGH (ref 4.0–10.5)
nRBC: 0 % (ref 0.0–0.2)

## 2020-06-01 LAB — COMPREHENSIVE METABOLIC PANEL
ALT: 40 U/L (ref 0–44)
AST: 65 U/L — ABNORMAL HIGH (ref 15–41)
Albumin: 3.6 g/dL (ref 3.5–5.0)
Alkaline Phosphatase: 60 U/L (ref 38–126)
Anion gap: 12 (ref 5–15)
BUN: 15 mg/dL (ref 8–23)
CO2: 20 mmol/L — ABNORMAL LOW (ref 22–32)
Calcium: 9.2 mg/dL (ref 8.9–10.3)
Chloride: 105 mmol/L (ref 98–111)
Creatinine, Ser: 0.69 mg/dL (ref 0.61–1.24)
GFR, Estimated: >60 mL/min (ref >60)
Glucose, Bld: 148 mg/dL — ABNORMAL HIGH (ref 70–99)
Potassium: 4.1 mmol/L (ref 3.5–5.1)
Sodium: 137 mmol/L (ref 135–145)
Total Bilirubin: 1.2 mg/dL (ref 0.3–1.2)
Total Protein: 7.9 g/dL (ref 6.5–8.1)

## 2020-06-01 LAB — LACTIC ACID, PLASMA
Lactic Acid, Venous: 2.3 mmol/L (ref 0.5–1.9)
Lactic Acid, Venous: 2.4 mmol/L (ref 0.5–1.9)

## 2020-06-01 LAB — RESP PANEL BY RT-PCR (FLU A&B, COVID) ARPGX2
Influenza A by PCR: NEGATIVE
Influenza B by PCR: NEGATIVE
SARS Coronavirus 2 by RT PCR: NEGATIVE

## 2020-06-01 LAB — URIC ACID: Uric Acid, Serum: 6.8 mg/dL (ref 3.7–8.6)

## 2020-06-01 LAB — C-REACTIVE PROTEIN: CRP: 10.6 mg/dL — ABNORMAL HIGH (ref <1.0)

## 2020-06-01 LAB — SEDIMENTATION RATE: Sed Rate: 8 mm/hr (ref 0–20)

## 2020-06-01 LAB — GLUCOSE, CAPILLARY: Glucose-Capillary: 178 mg/dL — ABNORMAL HIGH (ref 70–99)

## 2020-06-01 LAB — BRAIN NATRIURETIC PEPTIDE: B Natriuretic Peptide: 72 pg/mL (ref 0.0–100.0)

## 2020-06-01 MED ORDER — INSULIN ASPART 100 UNIT/ML IJ SOLN
0.0000 [IU] | Freq: Three times a day (TID) | INTRAMUSCULAR | Status: DC
  Administered 2020-06-02: 2 [IU] via SUBCUTANEOUS
  Administered 2020-06-02 – 2020-06-03 (×3): 1 [IU] via SUBCUTANEOUS
  Administered 2020-06-03 – 2020-06-04 (×3): 2 [IU] via SUBCUTANEOUS
  Filled 2020-06-01 (×7): qty 1

## 2020-06-01 MED ORDER — AMLODIPINE BESYLATE 5 MG PO TABS
10.0000 mg | ORAL_TABLET | Freq: Every day | ORAL | Status: DC
  Administered 2020-06-02 – 2020-06-04 (×3): 10 mg via ORAL
  Filled 2020-06-01 (×3): qty 2

## 2020-06-01 MED ORDER — VANCOMYCIN HCL 2000 MG/400ML IV SOLN: 2000.0000 mg | INTRAVENOUS | Status: DC

## 2020-06-01 MED ORDER — SODIUM CHLORIDE 0.9 % IV SOLN
2.0000 g | INTRAVENOUS | Status: DC
  Administered 2020-06-02 – 2020-06-03 (×2): 2 g via INTRAVENOUS
  Filled 2020-06-01 (×3): qty 20

## 2020-06-01 MED ORDER — INSULIN ASPART 100 UNIT/ML IJ SOLN: 0.0000 [IU] | Freq: Every day | INTRAMUSCULAR | Status: DC

## 2020-06-01 MED ORDER — AMLODIPINE BESY-BENAZEPRIL HCL 10-20 MG PO CAPS: 1.0000 | ORAL_CAPSULE | Freq: Every day | ORAL | Status: DC

## 2020-06-01 MED ORDER — METOPROLOL SUCCINATE ER 50 MG PO TB24
50.0000 mg | ORAL_TABLET | Freq: Every day | ORAL | Status: DC
  Administered 2020-06-02 – 2020-06-04 (×3): 50 mg via ORAL
  Filled 2020-06-01 (×3): qty 1

## 2020-06-01 MED ORDER — SIMVASTATIN 20 MG PO TABS
20.0000 mg | ORAL_TABLET | Freq: Every day | ORAL | Status: DC
  Administered 2020-06-02 – 2020-06-04 (×3): 20 mg via ORAL
  Filled 2020-06-01 (×3): qty 1

## 2020-06-01 MED ORDER — RISAQUAD PO CAPS
2.0000 | ORAL_CAPSULE | Freq: Every day | ORAL | Status: DC
  Administered 2020-06-02 – 2020-06-04 (×4): 2 via ORAL
  Filled 2020-06-01 (×3): qty 2

## 2020-06-01 MED ORDER — VANCOMYCIN HCL 2000 MG/400ML IV SOLN
2000.0000 mg | INTRAVENOUS | Status: DC
  Administered 2020-06-02 – 2020-06-03 (×2): 2000 mg via INTRAVENOUS
  Filled 2020-06-01 (×3): qty 400

## 2020-06-01 MED ORDER — ONDANSETRON HCL 4 MG/2ML IJ SOLN: 4.0000 mg | Freq: Three times a day (TID) | INTRAMUSCULAR | Status: DC | PRN

## 2020-06-01 MED ORDER — SODIUM CHLORIDE 0.9 % IV SOLN: INTRAVENOUS | Status: DC

## 2020-06-01 MED ORDER — HYDROCODONE-ACETAMINOPHEN 5-325 MG PO TABS
1.0000 | ORAL_TABLET | Freq: Four times a day (QID) | ORAL | Status: DC | PRN
  Administered 2020-06-01 – 2020-06-04 (×9): 1 via ORAL
  Filled 2020-06-01 (×9): qty 1

## 2020-06-01 MED ORDER — MORPHINE SULFATE (PF) 2 MG/ML IV SOLN: 0.5000 mg | INTRAVENOUS | Status: DC | PRN

## 2020-06-01 MED ORDER — NITROGLYCERIN 0.4 MG SL SUBL: 0.4000 mg | SUBLINGUAL_TABLET | SUBLINGUAL | Status: DC | PRN

## 2020-06-01 MED ORDER — HYDRALAZINE HCL 20 MG/ML IJ SOLN: 5.0000 mg | INTRAMUSCULAR | Status: DC | PRN

## 2020-06-01 MED ORDER — SODIUM CHLORIDE 0.9 % IV SOLN
2.0000 g | Freq: Once | INTRAVENOUS | Status: AC
  Administered 2020-06-01: 2 g via INTRAVENOUS
  Filled 2020-06-01: qty 20

## 2020-06-01 MED ORDER — MAGIC MOUTHWASH W/LIDOCAINE
10.0000 mL | Freq: Four times a day (QID) | ORAL | Status: DC
  Administered 2020-06-01 – 2020-06-04 (×10): 10 mL via ORAL
  Filled 2020-06-01 (×13): qty 10

## 2020-06-01 MED ORDER — VANCOMYCIN HCL 2000 MG/400ML IV SOLN
2000.0000 mg | Freq: Once | INTRAVENOUS | Status: AC
  Administered 2020-06-01: 2000 mg via INTRAVENOUS
  Filled 2020-06-01: qty 400

## 2020-06-01 MED ORDER — VANCOMYCIN HCL 2000 MG/400ML IV SOLN
2000.0000 mg | Freq: Once | INTRAVENOUS | Status: DC
  Filled 2020-06-01: qty 400

## 2020-06-01 MED ORDER — NYSTATIN 100000 UNIT/ML MT SUSP: 5.0000 mL | Freq: Four times a day (QID) | OROMUCOSAL | Status: DC

## 2020-06-01 MED ORDER — BENAZEPRIL HCL 20 MG PO TABS
20.0000 mg | ORAL_TABLET | Freq: Every day | ORAL | Status: DC
  Administered 2020-06-02 – 2020-06-03 (×2): 20 mg via ORAL
  Filled 2020-06-01 (×3): qty 1

## 2020-06-01 MED ORDER — LACTATED RINGERS IV BOLUS
1000.0000 mL | Freq: Once | INTRAVENOUS | Status: AC
  Administered 2020-06-01: 1000 mL via INTRAVENOUS

## 2020-06-01 MED ORDER — ENOXAPARIN SODIUM 80 MG/0.8ML IJ SOSY
0.5000 mg/kg | PREFILLED_SYRINGE | INTRAMUSCULAR | Status: DC
  Administered 2020-06-01 – 2020-06-03 (×3): 65 mg via SUBCUTANEOUS
  Filled 2020-06-01 (×5): qty 0.65

## 2020-06-01 MED ORDER — COLCHICINE 0.6 MG PO TABS
0.6000 mg | ORAL_TABLET | Freq: Two times a day (BID) | ORAL | Status: DC
  Administered 2020-06-01 – 2020-06-04 (×6): 0.6 mg via ORAL
  Filled 2020-06-01 (×6): qty 1

## 2020-06-01 MED ORDER — SODIUM CHLORIDE 0.9 % IV BOLUS
500.0000 mL | Freq: Once | INTRAVENOUS | Status: AC
  Administered 2020-06-01: 500 mL via INTRAVENOUS

## 2020-06-01 MED ORDER — ASPIRIN EC 81 MG PO TBEC
81.0000 mg | DELAYED_RELEASE_TABLET | Freq: Every day | ORAL | Status: DC
  Administered 2020-06-02 – 2020-06-04 (×3): 81 mg via ORAL
  Filled 2020-06-01 (×3): qty 1

## 2020-06-01 MED ORDER — ACETAMINOPHEN 325 MG PO TABS
650.0000 mg | ORAL_TABLET | Freq: Four times a day (QID) | ORAL | Status: DC | PRN
  Administered 2020-06-01 – 2020-06-02 (×3): 650 mg via ORAL
  Filled 2020-06-01 (×3): qty 2

## 2020-06-01 NOTE — ED Triage Notes (Signed)
Pt recently D/C from hospital with cellulitis to LLE, states pain is increasing, swelling and redness increasing as well.   Pt states home health nurse states concern for gout to L foot as well.

## 2020-06-01 NOTE — ED Provider Notes (Signed)
[facility]-EMERGENCY DEPARTMENT  ____________________________________________  Time seen: Approximately 5:12 PM  I have reviewed the triage vital signs and the nursing notes.   HISTORY  Chief Complaint Foot Swelling   Historian Patient     HPI Jordan Drake is a 80 y.o. male with a history of coronary artery disease, diabetes and hypertension, presents to the emergency with acute on chronic left lower extremity erythema and edema.  Patient was admitted on 05/28/2020 for left lower extremity cellulitis which responded well to Rocephin.  Patient was discharged with cefdinir.  Patient reports he has been compliant with his antibiotics.  Patient's son reports that left lower extremity swelling and erythema returned yesterday and looks markedly worse today.  No fever or chills at home.  No nausea, vomiting or diarrhea.  Patient denies chest pain, chest tightness or abdominal pain.   Past Medical History:  Diagnosis Date  . Coronary artery disease   . Diabetes mellitus without complication ([place])   . Hypertension      Immunizations up to date:  Yes.     Past Medical History:  Diagnosis Date  . Coronary artery disease   . Diabetes mellitus without complication ([place])   . Hypertension     Patient Active Problem List   Diagnosis Date Noted  . Coronary artery disease   . Chronic diastolic CHF (congestive heart failure) ([place])   . HLD (hyperlipidemia)   . Gout   . Severe sepsis ([place]) 05/29/2020  . Acute on chronic diastolic CHF (congestive heart failure) ([place]) 05/29/2020  . Elevated troponin 05/29/2020  . Generalized weakness 05/29/2020  . Lactic acidosis 05/29/2020  . Elevated CPK 05/29/2020  . Essential hypertension 05/29/2020  . Diabetes mellitus without complication ([place])   . Cellulitis of left lower extremity 05/28/2020  . Diabetic neuropathy ([place]) 08/25/2018    Past Surgical History:  Procedure Laterality Date  . cardiac stents      Prior to Admission medications    Medication Sig Start Date End Date Taking? Authorizing Provider  amLODipine-benazepril (LOTREL) 10-20 MG capsule Take 1 capsule by mouth daily. 07/19/18  Yes [provider]  aspirin EC 81 MG tablet Take 81 mg by mouth daily.   Yes [provider]  cefdinir (OMNICEF) 300 MG capsule Take 1 capsule (300 mg total) by mouth every 12 (twelve) hours for 3 days. 05/30/20 06/02/20 Yes Lorella Nimrod, MD  colchicine 0.6 MG tablet Take 1 tablet (0.6 mg total) by mouth 2 (two) times daily. 05/30/20  Yes Lorella Nimrod, MD  cyanocobalamin (,VITAMIN B-12,) 1000 MCG/ML injection Inject 1 mL (1,000 mcg total) into the muscle every 7 (seven) days. 05/30/20  Yes Lorella Nimrod, MD  glimepiride (AMARYL) 1 MG tablet Take 1 mg by mouth every morning.   Yes [provider]  HYDROcodone-acetaminophen (NORCO/VICODIN) 5-325 MG tablet Take 1 tablet by mouth every 6 (six) hours as needed for moderate pain.   Yes [provider]  metFORMIN (GLUCOPHAGE) 500 MG tablet Take 500 mg by mouth 2 (two) times daily with a meal. 08/19/18  Yes [provider]  metoprolol succinate (TOPROL-XL) 50 MG 24 hr tablet Take 1 tablet (50 mg total) by mouth daily. Take with or immediately following a meal. 05/31/20  Yes Lorella Nimrod, MD  nitroGLYCERIN (NITROSTAT) 0.4 MG SL tablet Place 0.4 mg under the tongue every 5 (five) minutes as needed for chest pain.   Yes [provider]  simvastatin (ZOCOR) 20 MG tablet Take 20 mg by mouth daily.   Yes  [provider]    Allergies Patient has no known allergies.  History reviewed. No pertinent family history.  Social History Social History   Tobacco Use  . Smoking status: Former Research scientist (life sciences)  . Smokeless tobacco: Former Network engineer Use Topics  . Alcohol use: Not Currently  . Drug use: Never     Review of Systems  Constitutional: No fever/chills Eyes:  No discharge ENT: No upper respiratory complaints. Respiratory: no cough. No SOB/ use  of accessory muscles to breath Gastrointestinal:   No nausea, no vomiting.  No diarrhea.  No constipation. Musculoskeletal: Negative for musculoskeletal pain. Skin: Patient has cellulitis.     ____________________________________________   PHYSICAL EXAM:  VITAL SIGNS: ED Triage Vitals  Enc Vitals Group     BP 06/01/20 1458 (!) 148/62     Pulse Rate 06/01/20 1458 76     Resp 06/01/20 1458 20     Temp 06/01/20 1458 98.3 F (36.8 C)     Temp Source 06/01/20 1458 Oral     SpO2 06/01/20 1458 95 %     Weight 06/01/20 1457 290 lb 8 oz (131.8 kg)     Height 06/01/20 1457 5\' 9"  (1.753 m)     Head Circumference --      Peak Flow --      Pain Score 06/01/20 1457 9     Pain Loc --      Pain Edu? --      Excl. in Show Low? --      Constitutional: Alert and oriented. Well appearing and in no acute distress. Eyes: Conjunctivae are normal. PERRL. EOMI. Head: Atraumatic. ENT:      Nose: No congestion/rhinnorhea.      Mouth/Throat: Mucous membranes are moist.  Neck: No stridor.  No cervical spine tenderness to palpation. Cardiovascular: Normal rate, regular rhythm. Normal S1 and S2.  Good peripheral circulation. Respiratory: Normal respiratory effort without tachypnea or retractions. Lungs CTAB. Good air entry to the bases with no decreased or absent breath sounds Gastrointestinal: Bowel sounds x 4 quadrants. Soft and nontender to palpation. No guarding or rigidity. No distention. Musculoskeletal: Full range of motion to all extremities. No obvious deformities noted Neurologic:  Normal for age. No gross focal neurologic deficits are appreciated.  Skin: Patient has erythema along the anterior aspect of the left lower leg from the skin just below left knee to left foot.  Patient has 3+ pitting edema.  Palpable dorsalis pedis pulses bilaterally and symmetrically.  Capillary refill less than 2 seconds on the left. Psychiatric: Mood and affect are normal for age. Speech and behavior are normal.    ____________________________________________   LABS (all labs ordered are listed, but only abnormal results are displayed)  Labs Reviewed  CBC WITH DIFFERENTIAL/PLATELET - Abnormal; Notable for the following components:      Result Value   WBC 11.1 (*)    Basophils Absolute 0.2 (*)    Abs Immature Granulocytes 0.62 (*)    All other components within normal limits  COMPREHENSIVE METABOLIC PANEL - Abnormal; Notable for the following components:   CO2 20 (*)    Glucose, Bld 148 (*)    AST 65 (*)    All other components within normal limits  LACTIC ACID, PLASMA - Abnormal; Notable for the following components:   Lactic Acid, Venous 2.3 (*)    All other components within normal limits  RESP PANEL BY RT-PCR (FLU A&B, COVID) ARPGX2  CULTURE, BLOOD (ROUTINE X 2)  CULTURE, BLOOD (ROUTINE  X 2)  BRAIN NATRIURETIC PEPTIDE  URIC ACID  SEDIMENTATION RATE  LACTIC ACID, PLASMA  C-REACTIVE PROTEIN   ____________________________________________  EKG   ____________________________________________  RADIOLOGY Unk Pinto, personally viewed and evaluated these images (plain radiographs) as part of my medical decision making, as well as reviewing the written report by the radiologist.  DG Foot Complete Left  Result Date: 06/01/2020 CLINICAL DATA:  Worsening left lower extremity cellulitis. EXAM: LEFT FOOT - COMPLETE 3+ VIEW COMPARISON:  None. FINDINGS: There is no evidence of an acute fracture or dislocation. Periarticular osteopenia is seen involving the metatarsophalangeal articulations of the second through fifth left toes. Mild to moderate severity degenerative changes are seen along the dorsal aspect of the mid left foot. There is marked severity vascular calcification with marked severity diffuse soft tissue swelling. This is most prominent along the dorsal aspect of the left ankle and left foot. IMPRESSION: Marked severity diffuse soft tissue swelling which is likely, in part,  secondary to a combination of diffuse cellulitis and the patient's body habitus. Electronically Signed   By: Virgina Norfolk M.D.   On: 06/01/2020 15:49    ____________________________________________    PROCEDURES  Procedure(s) performed:     Procedures     Medications  ondansetron (ZOFRAN) injection 4 mg (has no administration in time range)  hydrALAZINE (APRESOLINE) injection 5 mg (has no administration in time range)  acetaminophen (TYLENOL) tablet 650 mg (has no administration in time range)  insulin aspart (novoLOG) injection 0-9 Units (has no administration in time range)  insulin aspart (novoLOG) injection 0-5 Units (has no administration in time range)  HYDROcodone-acetaminophen (NORCO/VICODIN) 5-325 MG per tablet 1 tablet (has no administration in time range)  cefTRIAXone (ROCEPHIN) 2 g in sodium chloride 0.9 % 100 mL IVPB (has no administration in time range)  lactated ringers bolus 1,000 mL (1,000 mLs Intravenous New Bag/Given 06/01/20 1710)  cefTRIAXone (ROCEPHIN) 2 g in sodium chloride 0.9 % 100 mL IVPB (0 g Intravenous Stopped 06/01/20 1700)     ____________________________________________   INITIAL IMPRESSION / ASSESSMENT AND PLAN / ED COURSE  Pertinent labs & imaging results that were available during my care of the patient were reviewed by me and considered in my medical decision making (see chart for details).      Assessment and plan Cellulitis 80 year old male presents to the emergency department with worsening cellulitis.  Vital signs are reassuring at triage.  On exam, patient had 3+ pitting on the left with erythema that extended from the skin just below the left knee to the left foot.  CBC indicated mild leukocytosis.  CMP was within reference range.  Lactic acid was elevated at 2.3.  Repeat blood cultures were obtained.  Patient was given IV Rocephin and lactated Ringer's in the emergency department.  Hospitalist on-call, Dr.Xilin was  consulted who accepted patient for admission.   ____________________________________________  FINAL CLINICAL IMPRESSION(S) / ED DIAGNOSES  Final diagnoses:  Cellulitis of left lower extremity      NEW MEDICATIONS STARTED DURING THIS VISIT:  ED Discharge Orders    None          This chart was dictated using voice recognition software/Dragon. Despite best efforts to proofread, errors can occur which can change the meaning. Any change was purely unintentional.     Lannie Fields, PA-C 06/01/20 Estrellita Ludwig, MD 06/01/20 1739

## 2020-06-01 NOTE — H&P (Signed)
History and Physical    Devyn Sheerin BDZ:[id number] DOB: Jun 05, 1940 DOA: 06/01/2020  Referring MD/NP/PA:   PCP: Prince Solian, MD   Patient coming from:  The patient is coming from home.     Chief Complaint: Left lower leg pain  HPI: Nhia Heaphy is a 80 y.o. male with medical history significant of hypertension, hyperlipidemia, diabetes mellitus, gout, CAD, CABG, CHF, who presents with left lower leg pain.  Patient was recently hospitalized from 5/16-5/18 due to left lower leg cellulitis.  Patient was treated with ceftriaxone with good improvement and discharged on cefdinir.  Patient states that he has been taking cefdinir, but his left lower leg pain has been worsening.  He also has worsening swelling and erythema.  The pain is constant, sharp, 10 out of 10 severity, nonradiating.  Patient does not have fever or chills.  No chest pain, cough, shortness of breath.  Denies nausea, vomiting, diarrhea or abdominal pain.  No symptoms of UTI.  Patient had left lower leg venous Doppler on 05/28/2020, which was negative for DVT.  ED Course: pt was found to have WBC 11.1, lactic acid 2.3, pending COVID-19 PCR, electrolytes renal function okay, temperature normal, blood pressure 148/62, heart rate 76, RR 20, oxygen saturation 95% on room air.  X-ray of left foot is negative for bony fracture, but showed soft tissue swelling.  Patient is placed on MedSurg bed for observation.   Review of Systems:   General: no fevers, chills, no body weight gain, has fatigue HEENT: no blurry vision, hearing changes or sore throat Respiratory: no dyspnea, coughing, wheezing CV: no chest pain, no palpitations GI: no nausea, vomiting, abdominal pain, diarrhea, constipation GU: no dysuria, burning on urination, increased urinary frequency, hematuria  Ext: has left lower leg swelling, pain and erythema Neuro: no unilateral weakness, numbness, or tingling, no vision change or hearing loss Skin: no rash, no skin  tear. MSK: No muscle spasm, no deformity, no limitation of range of movement in spin Heme: No easy bruising.  Travel history: No recent long distant travel.  Allergy: No Known Allergies  Past Medical History:  Diagnosis Date  . Coronary artery disease   . Diabetes mellitus without complication ([place])   . Hypertension     Past Surgical History:  Procedure Laterality Date  . cardiac stents      Social History:  reports that he has quit smoking. He has quit using smokeless tobacco. He reports previous alcohol use. He reports that he does not use drugs.  Family History:  Family History  Problem Relation Age of Onset  . Heart attack Mother   . Emphysema Father      Prior to Admission medications   Medication Sig Start Date End Date Taking? Authorizing Provider  amLODipine-benazepril (LOTREL) 10-20 MG capsule Take 1 capsule by mouth daily. 07/19/18   [provider]  aspirin EC 81 MG tablet Take 81 mg by mouth daily.    [provider]  cefdinir (OMNICEF) 300 MG capsule Take 1 capsule (300 mg total) by mouth every 12 (twelve) hours for 3 days. 05/30/20 06/02/20  Lorella Nimrod, MD  colchicine 0.6 MG tablet Take 1 tablet (0.6 mg total) by mouth 2 (two) times daily. 05/30/20   Lorella Nimrod, MD  cyanocobalamin (,VITAMIN B-12,) 1000 MCG/ML injection Inject 1 mL (1,000 mcg total) into the muscle every 7 (seven) days. 05/30/20   Lorella Nimrod, MD  glimepiride (AMARYL) 1 MG tablet Take 1 mg by mouth every morning.  [provider]  HYDROcodone-acetaminophen (NORCO/VICODIN) 5-325 MG tablet Take 1 tablet by mouth every 6 (six) hours as needed for moderate pain.    [provider]  metFORMIN (GLUCOPHAGE) 500 MG tablet Take 500 mg by mouth 2 (two) times daily with a meal. 08/19/18   [provider]  metoprolol succinate (TOPROL-XL) 50 MG 24 hr tablet Take 1 tablet (50 mg total) by mouth daily. Take with or immediately following a meal. 05/31/20   Lorella Nimrod, MD  nitroGLYCERIN (NITROSTAT) 0.4 MG SL tablet Place 0.4 mg under the tongue every 5 (five) minutes as needed for chest pain.    [provider]  simvastatin (ZOCOR) 20 MG tablet Take 20 mg by mouth daily.    [provider]    Physical Exam: Vitals:   06/01/20 1457 06/01/20 1458  BP:  (!) 148/62  Pulse:  76  Resp:  20  Temp:  98.3 F (36.8 C)  TempSrc:  Oral  SpO2:  95%  Weight: 131.8 kg   Height: _0  (1.753 m)    General: Not in acute distress HEENT:       Eyes: PERRL, EOMI, no scleral icterus.       ENT: No discharge from the ears and nose, no pharynx injection, no tonsillar enlargement.        Neck: No JVD, no bruit, no mass felt. Heme: No neck lymph node enlargement. Cardiac: S1/S2, RRR, No murmurs, No gallops or rubs. Respiratory: No rales, wheezing, rhonchi or rubs. GI: Soft, nondistended, nontender, no rebound pain, no organomegaly, BS present. GU: No hematuria Ext: 1+DP/PT pulse bilaterally.  Has swelling, tenderness, erythema and warmth in left lower leg, spreading down to dorsal aspects of left foot.     Musculoskeletal: No joint deformities, No joint redness or warmth, no limitation of ROM in spin. Skin: No rashes.  Neuro: Alert, oriented X3, cranial nerves II-XII grossly intact, moves all extremities normally.  Psych: Patient is not psychotic, no suicidal or hemocidal ideation.  Labs on Admission: I have personally reviewed following labs and imaging studies  CBC: Recent Labs  Lab 05/28/20 1721 05/29/20 0545 05/30/20 0558 06/01/20 1507  WBC 23.3* 18.1* 13.6* 11.1*  NEUTROABS 20.8* 14.3*  --  6.7  HGB 14.5 14.3 14.5 15.2  HCT 42.9 41.4 41.9 44.4  MCV 95.3 94.5 93.7 92.5  PLT 175 158 165 428   Basic Metabolic Panel: Recent Labs  Lab 05/28/20 1721 05/29/20 0545 05/30/20 0558 06/01/20 1507  NA 139 137 134* 137  K 4.7 4.3 4.5 4.1  CL 103 102 103 105  CO2 22 24 21* 20*  GLUCOSE 226* 142* 172* 148*  BUN 27* 27* 19 15   CREATININE 1.38* 1.02 0.68 0.69  CALCIUM 8.9 8.4* 8.5* 9.2  MG 1.6* 2.1  --   --   PHOS  --  3.8  --   --    GFR: Estimated Creatinine Clearance: 97.4 mL/min (by C-G formula based on SCr of 0.69 mg/dL). Liver Function Tests: Recent Labs  Lab 05/28/20 1721 05/29/20 0545 06/01/20 1507  AST 67* 78* 65*  ALT 32 30 40  ALKPHOS 37* 36* 60  BILITOT 1.0 1.0 1.2  PROT 7.3 6.8 7.9  ALBUMIN 3.7 3.3* 3.6   No results for input(s): LIPASE, AMYLASE in the last 168 hours. No results for input(s): AMMONIA in the last 168 hours. Coagulation Profile: Recent Labs  Lab 05/29/20 0545  INR 1.3*   Cardiac Enzymes: Recent Labs  Lab 05/28/20  2005  CKTOTAL 1,886*   BNP (last 3 results) No results for input(s): PROBNP in the last 8760 hours. HbA1C: No results for input(s): HGBA1C in the last 72 hours. CBG: Recent Labs  Lab 05/29/20 1231 05/29/20 1636 05/29/20 2045 05/30/20 0842 05/30/20 1253  GLUCAP 179* 146* 154* 181* 196*   Lipid Profile: No results for input(s): CHOL, HDL, LDLCALC, TRIG, CHOLHDL, LDLDIRECT in the last 72 hours. Thyroid Function Tests: No results for input(s): TSH, T4TOTAL, FREET4, T3FREE, THYROIDAB in the last 72 hours. Anemia Panel: No results for input(s): VITAMINB12, FOLATE, FERRITIN, TIBC, IRON, RETICCTPCT in the last 72 hours. Urine analysis:    Component Value Date/Time   COLORURINE YELLOW (A) 05/28/2020 2059   APPEARANCEUR HAZY (A) 05/28/2020 2059   LABSPEC 1.015 05/28/2020 2059   PHURINE 5.0 05/28/2020 2059   GLUCOSEU 50 (A) 05/28/2020 2059   HGBUR SMALL (A) 05/28/2020 2059   BILIRUBINUR NEGATIVE 05/28/2020 2059   KETONESUR 5 (A) 05/28/2020 2059   PROTEINUR 30 (A) 05/28/2020 2059   NITRITE NEGATIVE 05/28/2020 2059   LEUKOCYTESUR NEGATIVE 05/28/2020 2059   Sepsis Labs: _0 (procalcitonin:4,lacticidven:4) ) Recent Results (from the past 240 hour(s))  Resp Panel by RT-PCR (Flu A&B, Covid) Nasopharyngeal Swab     Status: None   Collection  Time: 05/28/20  4:58 PM   Specimen: Nasopharyngeal Swab; Nasopharyngeal(NP) swabs in vial transport medium  Result Value Ref Range Status   SARS Coronavirus 2 by RT PCR NEGATIVE NEGATIVE Final    Comment: (NOTE) SARS-CoV-2 target nucleic acids are NOT DETECTED.  The SARS-CoV-2 RNA is generally detectable in upper respiratory specimens during the acute phase of infection. The lowest concentration of SARS-CoV-2 viral copies this assay can detect is 138 copies/mL. A negative result does not preclude SARS-Cov-2 infection and should not be used as the sole basis for treatment or other patient management decisions. A negative result may occur with  improper specimen collection/handling, submission of specimen other than nasopharyngeal swab, presence of viral mutation(s) within the areas targeted by this assay, and inadequate number of viral copies(<138 copies/mL). A negative result must be combined with clinical observations, patient history, and epidemiological information. The expected result is Negative.  Fact Sheet for Patients:  EntrepreneurPulse.com.au  Fact Sheet for Healthcare Providers:  IncredibleEmployment.be  This test is no t yet approved or cleared by the [place] FDA and  has been authorized for detection and/or diagnosis of SARS-CoV-2 by FDA under an Emergency Use Authorization (EUA). This EUA will remain  in effect (meaning this test can be used) for the duration of the COVID-19 declaration under Section 564(b)(1) of the Act, 21 U.S.C.section 360bbb-3(b)(1), unless the authorization is terminated  or revoked sooner.       Influenza A by PCR NEGATIVE NEGATIVE Final   Influenza B by PCR NEGATIVE NEGATIVE Final    Comment: (NOTE) The Xpert Xpress SARS-CoV-2/FLU/RSV plus assay is intended as an aid in the diagnosis of influenza from Nasopharyngeal swab specimens and should not be used as a sole basis for treatment. Nasal washings  and aspirates are unacceptable for Xpert Xpress SARS-CoV-2/FLU/RSV testing.  Fact Sheet for Patients: EntrepreneurPulse.com.au  Fact Sheet for Healthcare Providers: IncredibleEmployment.be  This test is not yet approved or cleared by the [place] FDA and has been authorized for detection and/or diagnosis of SARS-CoV-2 by FDA under an Emergency Use Authorization (EUA). This EUA will remain in effect (meaning this test can be used) for the duration of the COVID-19 declaration under Section 564(b)(1) of the Act,  21 U.S.C. section 360bbb-3(b)(1), unless the authorization is terminated or revoked.  Performed at [facility], [address] [place], [place] [address]   Culture, blood (Routine X 2) w Reflex to ID Panel     Status: None (Preliminary result)   Collection Time: 05/28/20  8:01 PM   Specimen: BLOOD  Result Value Ref Range Status   Specimen Description BLOOD BLOOD RIGHT HAND  Final   Special Requests   Final    BOTTLES DRAWN AEROBIC AND ANAEROBIC Blood Culture results may not be optimal due to an inadequate volume of blood received in culture bottles   Culture   Final    NO GROWTH 4 DAYS Performed at [facility], [address], [place], [place] [address]    Report Status PENDING  Incomplete  Culture, blood (Routine X 2) w Reflex to ID Panel     Status: None (Preliminary result)   Collection Time: 05/28/20  8:14 PM   Specimen: BLOOD  Result Value Ref Range Status   Specimen Description BLOOD BLOOD LEFT HAND  Final   Special Requests   Final    BOTTLES DRAWN AEROBIC AND ANAEROBIC Blood Culture results may not be optimal due to an inadequate volume of blood received in culture bottles   Culture   Final    NO GROWTH 4 DAYS Performed at [facility], [address] [place], [place] [address]    Report Status PENDING  Incomplete  MRSA PCR Screening     Status: None   Collection Time: 05/29/20  4:23  PM   Specimen: Nasopharyngeal  Result Value Ref Range Status   MRSA by PCR NEGATIVE NEGATIVE Final    Comment:        The GeneXpert MRSA Assay (FDA approved for NASAL specimens only), is one component of a comprehensive MRSA colonization surveillance program. It is not intended to diagnose MRSA infection nor to guide or monitor treatment for MRSA infections. Performed at [facility], [place], [place] [address]   Resp Panel by RT-PCR (Flu A&B, Covid) Nasopharyngeal Swab     Status: None   Collection Time: 06/01/20  4:10 PM   Specimen: Nasopharyngeal Swab; Nasopharyngeal(NP) swabs in vial transport medium  Result Value Ref Range Status   SARS Coronavirus 2 by RT PCR NEGATIVE NEGATIVE Final    Comment: (NOTE) SARS-CoV-2 target nucleic acids are NOT DETECTED.  The SARS-CoV-2 RNA is generally detectable in upper respiratory specimens during the acute phase of infection. The lowest concentration of SARS-CoV-2 viral copies this assay can detect is 138 copies/mL. A negative result does not preclude SARS-Cov-2 infection and should not be used as the sole basis for treatment or other patient management decisions. A negative result may occur with  improper specimen collection/handling, submission of specimen other than nasopharyngeal swab, presence of viral mutation(s) within the areas targeted by this assay, and inadequate number of viral copies(<138 copies/mL). A negative result must be combined with clinical observations, patient history, and epidemiological information. The expected result is Negative.  Fact Sheet for Patients:  EntrepreneurPulse.com.au  Fact Sheet for Healthcare Providers:  IncredibleEmployment.be  This test is no t yet approved or cleared by the [place] FDA and  has been authorized for detection and/or diagnosis of SARS-CoV-2 by FDA under an Emergency Use Authorization (EUA). This EUA will  remain  in effect (meaning this test can be used) for the duration of the COVID-19 declaration under Section 564(b)(1) of the Act, 21 U.S.C.section  360bbb-3(b)(1), unless the authorization is terminated  or revoked sooner.       Influenza A by PCR NEGATIVE NEGATIVE Final   Influenza B by PCR NEGATIVE NEGATIVE Final    Comment: (NOTE) The Xpert Xpress SARS-CoV-2/FLU/RSV plus assay is intended as an aid in the diagnosis of influenza from Nasopharyngeal swab specimens and should not be used as a sole basis for treatment. Nasal washings and aspirates are unacceptable for Xpert Xpress SARS-CoV-2/FLU/RSV testing.  Fact Sheet for Patients: EntrepreneurPulse.com.au  Fact Sheet for Healthcare Providers: IncredibleEmployment.be  This test is not yet approved or cleared by the [place] FDA and has been authorized for detection and/or diagnosis of SARS-CoV-2 by FDA under an Emergency Use Authorization (EUA). This EUA will remain in effect (meaning this test can be used) for the duration of the COVID-19 declaration under Section 564(b)(1) of the Act, 21 U.S.C. section 360bbb-3(b)(1), unless the authorization is terminated or revoked.  Performed at [facility], [address] [place], [place] [address]      Radiological Exams on Admission: DG Foot Complete Left  Result Date: 06/01/2020 CLINICAL DATA:  Worsening left lower extremity cellulitis. EXAM: LEFT FOOT - COMPLETE 3+ VIEW COMPARISON:  None. FINDINGS: There is no evidence of an acute fracture or dislocation. Periarticular osteopenia is seen involving the metatarsophalangeal articulations of the second through fifth left toes. Mild to moderate severity degenerative changes are seen along the dorsal aspect of the mid left foot. There is marked severity vascular calcification with marked severity diffuse soft tissue swelling. This is most prominent along the dorsal aspect of the left  ankle and left foot. IMPRESSION: Marked severity diffuse soft tissue swelling which is likely, in part, secondary to a combination of diffuse cellulitis and the patient's body habitus. Electronically Signed   By: Virgina Norfolk M.D.   On: 06/01/2020 15:49     EKG: I have personally reviewed.  Sinus rhythm, QTC 448, early R wave progression, PVC, left axis deviation  Assessment/Plan Principal Problem:   Cellulitis of left lower extremity Active Problems:   Lactic acidosis   Essential hypertension   Diabetes mellitus without complication (HCC)   Coronary artery disease   Chronic diastolic CHF (congestive heart failure) (HCC)   HLD (hyperlipidemia)   Gout   Cellulitis of left lower extremity: Patient failed outpatient cefdinir treatment.  Symptoms are severe, lactic acid is elevated, but does not meet criteria for sepsis.  Hemodynamically stable.  - Admitted to MedSurg bed as inpatient - Empiric antimicrobial treatment with vancomycin and Rocephin - PRN Zofran for nausea, morphine and Norco for pain - Blood cultures x 2  - ESR and CRP  Lactic acidosis: Lactic acid 2.3, does not meet criteria for sepsis. -IV fluid: NS bolus 500cc and then Normal saline 75 cc/h -Trend lactic acid level (  Essential hypertension -IV hydralazine as needed -Continue home Lotrel and metoprolol  Diabetes mellitus without complication ([place]): Recent A1c 7.2, poorly controlled.  Patient is taking metformin Amaryl at home -Sliding scale insulin  Coronary artery disease: S/p of CABG.  No chest pain -Continue home aspirin, Zocor, as needed nitroglycerin  Chronic diastolic CHF (congestive heart failure) ([place]): Patient does not have shortness of breath, CHF seem to be compensated.  2D echo on 05/29/2020 showed EF of 55-8% with grade 2 diastolic dysfunction. -Check BNP --> 72  HLD (hyperlipidemia) -Zocor  Gout -Continue colchicine -Check a uric acid level     DVT ppx:  SQ Lovenox Code Status:  DNR per  pt and his son Family Communication: yes, patient's son    at bed side Disposition Plan:  Anticipate discharge back to previous environment Consults called:  none Admission status and Level of care: Med-Surg:  as inpt      Status is: Inpatient  Remains inpatient appropriate because:Inpatient level of care appropriate due to severity of illness   Dispo: The patient is from: Home              Anticipated d/c is to: Home              Patient currently is not medically stable to d/c.   Difficult to place patient No          Date of Service 06/01/2020    Ivor Costa [facility]   If 7PM-7AM, please contact night-coverage [email or web address] 06/01/2020, 5:40 PM

## 2020-06-01 NOTE — Progress Notes (Signed)
Pharmacy Antibiotic Note  Jordan Drake is a 80 y.o. male with a history of coronary artery disease, CHF, gout, HLD, diabetes and hypertension, presents to the emergency with acute on chronic left lower extremity erythema and edema.  Patient was admitted on 05/28/2020 for left lower extremity cellulitis which responded well to Rocephin. Pt was discharged on cefdinir and pt reports compliance with antibiotics, however pt reported his left lower leg pain has been worsening. Pharmacy has been consulted for vancomycin dosing for cellulitis.  WBC 11.1; lactic acid 2.3; afebrile; Scr <1  Plan:  Will give vancomycin 2g IV x1 loading dose followed by 2g IV q24 h maintenance dose  Est AUC: 470  Est Cpk: 38  Est Ctr: 9.3  Obtain vancomycin levels around 4th or 5th dose if continued  Monitor renal function and adjust dose as clinically indicated  Follow up cultures and ability to de-escalate antibiotics as appropriate  Height: 5\' 9"  (175.3 cm) Weight: 131.8 kg (290 lb 8 oz) IBW/kg (Calculated) : 70.7  Temp (24hrs), Avg:98.3 F (36.8 C), Min:98.3 F (36.8 C), Max:98.3 F (36.8 C)  Recent Labs  Lab 05/28/20 1721 05/28/20 1722 05/28/20 2014 05/29/20 0545 05/30/20 0558 06/01/20 1507  WBC 23.3*  --   --  18.1* 13.6* 11.1*  CREATININE 1.38*  --   --  1.02 0.68 0.69  LATICACIDVEN  --  5.4* 6.0* 2.3*  --  2.3*    Estimated Creatinine Clearance: 97.4 mL/min (by C-G formula based on SCr of 0.69 mg/dL).    No Known Allergies  Antimicrobials this admission: 5/20 ceftriaxone >> 5/20 vancomycin >>   Dose adjustments this admission:   Microbiology results: 5/20 BCx: sent  Thank you for allowing pharmacy to be a part of this patient's care.  Sherilyn Banker, PharmD Pharmacy Resident  06/01/2020 5:33 PM

## 2020-06-01 NOTE — Progress Notes (Signed)
Patient arrived from ED at 18:26. Patient states he has to use the bathroom now. Walked to bathroom. Very loud flatulence and what sounds like diarrhea

## 2020-06-01 NOTE — Progress Notes (Signed)
After reviewing chart to accept patient, I did ask Jossie Ng RN from ED if patients order EKG was read. He stated he did not see it. MD notes from ER note patient had one and does not state results. No troponin was drawn... verified this as patient left here on 5/18 after severe sepsis and heart issues, and is now being readmitted without a troponin and without results being known of EKg. As his lactic acid is elevated again, I am worried but will still accept patient to the floor. Hopefully EKG is in chart

## 2020-06-01 NOTE — ED Notes (Signed)
Dr Niu at bedside 

## 2020-06-02 DIAGNOSIS — E872 Acidosis: Secondary | ICD-10-CM | POA: Diagnosis not present

## 2020-06-02 DIAGNOSIS — E119 Type 2 diabetes mellitus without complications: Secondary | ICD-10-CM | POA: Diagnosis not present

## 2020-06-02 DIAGNOSIS — I10 Essential (primary) hypertension: Secondary | ICD-10-CM | POA: Diagnosis not present

## 2020-06-02 DIAGNOSIS — L03116 Cellulitis of left lower limb: Secondary | ICD-10-CM | POA: Diagnosis not present

## 2020-06-02 LAB — CULTURE, BLOOD (ROUTINE X 2)
Culture: NO GROWTH
Culture: NO GROWTH

## 2020-06-02 LAB — BASIC METABOLIC PANEL
Anion gap: 10 (ref 5–15)
BUN: 13 mg/dL (ref 8–23)
CO2: 22 mmol/L (ref 22–32)
Calcium: 8.9 mg/dL (ref 8.9–10.3)
Chloride: 108 mmol/L (ref 98–111)
Creatinine, Ser: 0.79 mg/dL (ref 0.61–1.24)
GFR, Estimated: >60 mL/min (ref >60)
Glucose, Bld: 127 mg/dL — ABNORMAL HIGH (ref 70–99)
Potassium: 4.1 mmol/L (ref 3.5–5.1)
Sodium: 140 mmol/L (ref 135–145)

## 2020-06-02 LAB — CBC
HCT: 42.9 % (ref 39.0–52.0)
Hemoglobin: 14.7 g/dL (ref 13.0–17.0)
MCH: 32.2 pg (ref 26.0–34.0)
MCHC: 34.3 g/dL (ref 30.0–36.0)
MCV: 93.9 fL (ref 80.0–100.0)
Platelets: 211 10*3/uL (ref 150–400)
RBC: 4.57 MIL/uL (ref 4.22–5.81)
RDW: 12.8 % (ref 11.5–15.5)
WBC: 10.3 10*3/uL (ref 4.0–10.5)
nRBC: 0 % (ref 0.0–0.2)

## 2020-06-02 LAB — GLUCOSE, CAPILLARY
Glucose-Capillary: 129 mg/dL — ABNORMAL HIGH (ref 70–99)
Glucose-Capillary: 197 mg/dL — ABNORMAL HIGH (ref 70–99)
Glucose-Capillary: 146 mg/dL — ABNORMAL HIGH (ref 70–99)
Glucose-Capillary: 153 mg/dL — ABNORMAL HIGH (ref 70–99)

## 2020-06-02 NOTE — Progress Notes (Addendum)
PROGRESS NOTE    Jordan Drake  [id number] DOB: [birth year] DOA: 06/01/2020 PCP: Prince Solian, MD   Follow-up on cellulitis. Brief Narrative:  Jordan Drake is a 80 y.o. male with medical history significant of hypertension, hyperlipidemia, diabetes mellitus, gout, CAD, CABG, CHF, who presents with left lower leg pain.  He was started on vancomycin and Rocephin.   Assessment & Plan:   Principal Problem:   Cellulitis of left lower extremity Active Problems:   Lactic acidosis   Essential hypertension   Diabetes mellitus without complication (HCC)   Coronary artery disease   Chronic diastolic CHF (congestive heart failure) (HCC)   HLD (hyperlipidemia)   Gout  #1.  Left lower extremity cellulitis. Patient failed outpatient treatment.  Currently on vancomycin and Rocephin.  Condition much improved today.  I will add Ace wrap to reduce edema.  Continue antibiotics, most likely can be discharged home tomorrow.  2.  Lactic acidosis secondary to metformin. Metformin discontinued.  3.  Essential hypertension. Continue home medicines per  4.  Type 2 diabetes Continue sliding scale insulin.     DVT prophylaxis: Lovenox Code Status: DNR Family Communication: son updated Disposition Plan:  .   Status is: Inpatient  Remains inpatient appropriate because:Inpatient level of care appropriate due to severity of illness   Dispo: The patient is from: Home              Anticipated d/c is to: Home              Patient currently is not medically stable to d/c.   Difficult to place patient No        I/O last 3 completed shifts: In: 660.5 [P.O.:240; I.V.:420.5] Out: -  No intake/output data recorded.     Consultants:   None  Procedures: None  Antimicrobials: Vancomycin and Rocephin.  Subjective: Patient doing better today, left leg still swelling and red.  But better than yesterday.  Less pain. Denies any fever or chills. No abdominal pain or nausea  vomiting. No dysuria hematuria  Objective: Vitals:   06/01/20 2156 06/01/20 2356 06/02/20 0442 06/02/20 0736  BP: 140/67 134/61 (!) 145/61 (!) 155/65  Pulse: 69 64 64 (!) 59  Resp: 16 16 18 18   Temp: 97.7 F (36.5 C) 97.9 F (36.6 C) 98 F (36.7 C) 97.8 F (36.6 C)  TempSrc:  Oral Oral Oral  SpO2: 97% 97% 96% 98%  Weight:      Height:        Intake/Output Summary (Last 24 hours) at 06/02/2020 1032 Last data filed at 06/02/2020 0442 Gross per 24 hour  Intake 660.48 ml  Output --  Net 660.48 ml   Filed Weights   06/01/20 1457  Weight: 131.8 kg    Examination:  General exam: Appears calm and comfortable  Respiratory system: Clear to auscultation. Respiratory effort normal. Cardiovascular system: S1 & S2 heard, RRR. No JVD, murmurs, rubs, gallops or clicks. No pedal edema. Gastrointestinal system: Abdomen is nondistended, soft and nontender. No organomegaly or masses felt. Normal bowel sounds heard. Central nervous system: Alert and oriented. No focal neurological deficits. Extremities: Left leg red and swelling. Skin: No rashes, lesions or ulcers Psychiatry: Judgement and insight appear normal. Mood & affect appropriate.     Data Reviewed: I have personally reviewed following labs and imaging studies  CBC: Recent Labs  Lab 05/28/20 1721 05/29/20 0545 05/30/20 0558 06/01/20 1507 06/02/20 0543  WBC 23.3* 18.1* 13.6* 11.1* 10.3  NEUTROABS 20.8* 14.3*  --  6.7  --   HGB 14.5 14.3 14.5 15.2 14.7  HCT 42.9 41.4 41.9 44.4 42.9  MCV 95.3 94.5 93.7 92.5 93.9  PLT 175 158 165 232 585   Basic Metabolic Panel: Recent Labs  Lab 05/28/20 1721 05/29/20 0545 05/30/20 0558 06/01/20 1507 06/02/20 0543  NA 139 137 134* 137 140  K 4.7 4.3 4.5 4.1 4.1  CL 103 102 103 105 108  CO2 22 24 21* 20* 22  GLUCOSE 226* 142* 172* 148* 127*  BUN 27* 27* 19 15 13   CREATININE 1.38* 1.02 0.68 0.69 0.79  CALCIUM 8.9 8.4* 8.5* 9.2 8.9  MG 1.6* 2.1  --   --   --   PHOS  --  3.8   --   --   --    GFR: Estimated Creatinine Clearance: 97.4 mL/min (by C-G formula based on SCr of 0.79 mg/dL). Liver Function Tests: Recent Labs  Lab 05/28/20 1721 05/29/20 0545 06/01/20 1507  AST 67* 78* 65*  ALT 32 30 40  ALKPHOS 37* 36* 60  BILITOT 1.0 1.0 1.2  PROT 7.3 6.8 7.9  ALBUMIN 3.7 3.3* 3.6   No results for input(s): LIPASE, AMYLASE in the last 168 hours. No results for input(s): AMMONIA in the last 168 hours. Coagulation Profile: Recent Labs  Lab 05/29/20 0545  INR 1.3*   Cardiac Enzymes: Recent Labs  Lab 05/28/20 2005  CKTOTAL 1,886*   BNP (last 3 results) No results for input(s): PROBNP in the last 8760 hours. HbA1C: No results for input(s): HGBA1C in the last 72 hours. CBG: Recent Labs  Lab 05/29/20 2045 05/30/20 0842 05/30/20 1253 06/01/20 2200 06/02/20 0738  GLUCAP 154* 181* 196* 178* 129*   Lipid Profile: No results for input(s): CHOL, HDL, LDLCALC, TRIG, CHOLHDL, LDLDIRECT in the last 72 hours. Thyroid Function Tests: No results for input(s): TSH, T4TOTAL, FREET4, T3FREE, THYROIDAB in the last 72 hours. Anemia Panel: No results for input(s): VITAMINB12, FOLATE, FERRITIN, TIBC, IRON, RETICCTPCT in the last 72 hours. Sepsis Labs: Recent Labs  Lab 05/28/20 2014 05/29/20 0545 06/01/20 1507 06/01/20 1843  LATICACIDVEN 6.0* 2.3* 2.3* 2.4*    Recent Results (from the past 240 hour(s))  Resp Panel by RT-PCR (Flu A&B, Covid) Nasopharyngeal Swab     Status: None   Collection Time: 05/28/20  4:58 PM   Specimen: Nasopharyngeal Swab; Nasopharyngeal(NP) swabs in vial transport medium  Result Value Ref Range Status   SARS Coronavirus 2 by RT PCR NEGATIVE NEGATIVE Final    Comment: (NOTE) SARS-CoV-2 target nucleic acids are NOT DETECTED.  The SARS-CoV-2 RNA is generally detectable in upper respiratory specimens during the acute phase of infection. The lowest concentration of SARS-CoV-2 viral copies this assay can detect is 138 copies/mL. A  negative result does not preclude SARS-Cov-2 infection and should not be used as the sole basis for treatment or other patient management decisions. A negative result may occur with  improper specimen collection/handling, submission of specimen other than nasopharyngeal swab, presence of viral mutation(s) within the areas targeted by this assay, and inadequate number of viral copies(<138 copies/mL). A negative result must be combined with clinical observations, patient history, and epidemiological information. The expected result is Negative.  Fact Sheet for Patients:  EntrepreneurPulse.com.au  Fact Sheet for Healthcare Providers:  IncredibleEmployment.be  This test is no t yet approved or cleared by the [place] FDA and  has been authorized for detection and/or diagnosis of SARS-CoV-2 by FDA under an Emergency Use Authorization (EUA). This EUA  will remain  in effect (meaning this test can be used) for the duration of the COVID-19 declaration under Section 564(b)(1) of the Act, 21 U.S.C.section 360bbb-3(b)(1), unless the authorization is terminated  or revoked sooner.       Influenza A by PCR NEGATIVE NEGATIVE Final   Influenza B by PCR NEGATIVE NEGATIVE Final    Comment: (NOTE) The Xpert Xpress SARS-CoV-2/FLU/RSV plus assay is intended as an aid in the diagnosis of influenza from Nasopharyngeal swab specimens and should not be used as a sole basis for treatment. Nasal washings and aspirates are unacceptable for Xpert Xpress SARS-CoV-2/FLU/RSV testing.  Fact Sheet for Patients: EntrepreneurPulse.com.au  Fact Sheet for Healthcare Providers: IncredibleEmployment.be  This test is not yet approved or cleared by the [place] FDA and has been authorized for detection and/or diagnosis of SARS-CoV-2 by FDA under an Emergency Use Authorization (EUA). This EUA will remain in effect (meaning this test can  be used) for the duration of the COVID-19 declaration under Section 564(b)(1) of the Act, 21 U.S.C. section 360bbb-3(b)(1), unless the authorization is terminated or revoked.  Performed at [facility], [address] [place], [place] [address]   Culture, blood (Routine X 2) w Reflex to ID Panel     Status: None   Collection Time: 05/28/20  8:01 PM   Specimen: BLOOD  Result Value Ref Range Status   Specimen Description BLOOD BLOOD RIGHT HAND  Final   Special Requests   Final    BOTTLES DRAWN AEROBIC AND ANAEROBIC Blood Culture results may not be optimal due to an inadequate volume of blood received in culture bottles   Culture   Final    NO GROWTH 5 DAYS Performed at [facility], [address] [place], [place] [address]    Report Status 06/02/2020 FINAL  Final  Culture, blood (Routine X 2) w Reflex to ID Panel     Status: None   Collection Time: 05/28/20  8:14 PM   Specimen: BLOOD  Result Value Ref Range Status   Specimen Description BLOOD BLOOD LEFT HAND  Final   Special Requests   Final    BOTTLES DRAWN AEROBIC AND ANAEROBIC Blood Culture results may not be optimal due to an inadequate volume of blood received in culture bottles   Culture   Final    NO GROWTH 5 DAYS Performed at [facility], [address] [place], [place] [address]    Report Status 06/02/2020 FINAL  Final  MRSA PCR Screening     Status: None   Collection Time: 05/29/20  4:23 PM   Specimen: Nasopharyngeal  Result Value Ref Range Status   MRSA by PCR NEGATIVE NEGATIVE Final    Comment:        The GeneXpert MRSA Assay (FDA approved for NASAL specimens only), is one component of a comprehensive MRSA colonization surveillance program. It is not intended to diagnose MRSA infection nor to guide or monitor treatment for MRSA infections. Performed at [facility], [place],  [address]   Resp Panel by RT-PCR (Flu A&B, Covid) Nasopharyngeal  Swab     Status: None   Collection Time: 06/01/20  4:10 PM   Specimen: Nasopharyngeal Swab; Nasopharyngeal(NP) swabs in vial transport medium  Result Value Ref Range Status   SARS Coronavirus 2 by RT PCR NEGATIVE NEGATIVE Final    Comment: (NOTE) SARS-CoV-2 target nucleic acids are NOT DETECTED.  The SARS-CoV-2 RNA is generally detectable in upper respiratory specimens during the  acute phase of infection. The lowest concentration of SARS-CoV-2 viral copies this assay can detect is 138 copies/mL. A negative result does not preclude SARS-Cov-2 infection and should not be used as the sole basis for treatment or other patient management decisions. A negative result may occur with  improper specimen collection/handling, submission of specimen other than nasopharyngeal swab, presence of viral mutation(s) within the areas targeted by this assay, and inadequate number of viral copies(<138 copies/mL). A negative result must be combined with clinical observations, patient history, and epidemiological information. The expected result is Negative.  Fact Sheet for Patients:  EntrepreneurPulse.com.au  Fact Sheet for Healthcare Providers:  IncredibleEmployment.be  This test is no t yet approved or cleared by the [place] FDA and  has been authorized for detection and/or diagnosis of SARS-CoV-2 by FDA under an Emergency Use Authorization (EUA). This EUA will remain  in effect (meaning this test can be used) for the duration of the COVID-19 declaration under Section 564(b)(1) of the Act, 21 U.S.C.section 360bbb-3(b)(1), unless the authorization is terminated  or revoked sooner.       Influenza A by PCR NEGATIVE NEGATIVE Final   Influenza B by PCR NEGATIVE NEGATIVE Final    Comment: (NOTE) The Xpert Xpress SARS-CoV-2/FLU/RSV plus assay is intended as an aid in the diagnosis of influenza from Nasopharyngeal swab specimens and should not be used as a sole  basis for treatment. Nasal washings and aspirates are unacceptable for Xpert Xpress SARS-CoV-2/FLU/RSV testing.  Fact Sheet for Patients: EntrepreneurPulse.com.au  Fact Sheet for Healthcare Providers: IncredibleEmployment.be  This test is not yet approved or cleared by the [place] FDA and has been authorized for detection and/or diagnosis of SARS-CoV-2 by FDA under an Emergency Use Authorization (EUA). This EUA will remain in effect (meaning this test can be used) for the duration of the COVID-19 declaration under Section 564(b)(1) of the Act, 21 U.S.C. section 360bbb-3(b)(1), unless the authorization is terminated or revoked.  Performed at [facility], [address] [place], [place] [address]   Blood culture (routine x 2)     Status: None (Preliminary result)   Collection Time: 06/01/20  4:10 PM   Specimen: BLOOD  Result Value Ref Range Status   Specimen Description BLOOD RIGHT ANTECUBITAL  Final   Special Requests   Final    BOTTLES DRAWN AEROBIC AND ANAEROBIC Blood Culture results may not be optimal due to an inadequate volume of blood received in culture bottles   Culture   Final    NO GROWTH < 24 HOURS Performed at [facility], [address] [place], [place] [address]    Report Status PENDING  Incomplete  Blood culture (routine x 2)     Status: None (Preliminary result)   Collection Time: 06/01/20  4:10 PM   Specimen: BLOOD  Result Value Ref Range Status   Specimen Description BLOOD LEFT ANTECUBITAL  Final   Special Requests   Final    BOTTLES DRAWN AEROBIC AND ANAEROBIC Blood Culture adequate volume   Culture   Final    NO GROWTH < 24 HOURS Performed at [facility], [address], [place], [place] [address]    Report Status PENDING  Incomplete         Radiology Studies: DG Foot Complete Left  Result Date: 06/01/2020 CLINICAL DATA:  Worsening left lower extremity cellulitis. EXAM:  LEFT FOOT - COMPLETE 3+ VIEW COMPARISON:  None. FINDINGS: There is no evidence of an acute fracture or dislocation. Periarticular osteopenia  is seen involving the metatarsophalangeal articulations of the second through fifth left toes. Mild to moderate severity degenerative changes are seen along the dorsal aspect of the mid left foot. There is marked severity vascular calcification with marked severity diffuse soft tissue swelling. This is most prominent along the dorsal aspect of the left ankle and left foot. IMPRESSION: Marked severity diffuse soft tissue swelling which is likely, in part, secondary to a combination of diffuse cellulitis and the patient's body habitus. Electronically Signed   By: Virgina Norfolk M.D.   On: 06/01/2020 15:49        Scheduled Meds: . acidophilus  2 capsule Oral Daily  . amLODipine  10 mg Oral Daily   And  . benazepril  20 mg Oral Daily  . aspirin EC  81 mg Oral Daily  . colchicine  0.6 mg Oral BID  . enoxaparin (LOVENOX) injection  0.5 mg/kg Subcutaneous Q24H  . insulin aspart  0-5 Units Subcutaneous QHS  . insulin aspart  0-9 Units Subcutaneous TID WC  . magic mouthwash w/lidocaine  10 mL Oral QID  . metoprolol succinate  50 mg Oral Daily  . simvastatin  20 mg Oral Daily   Continuous Infusions: . cefTRIAXone (ROCEPHIN)  IV    . vancomycin       LOS: 1 day    Time spent: 27 minutes    Sharen Hones, MD [facility]   To contact the attending provider between 7A-7P or the covering provider during after hours 7P-7A, please log into the web site [email or web address] and access using universal [place] password for that web site. If you do not have the password, please call the hospital operator.  06/02/2020, 10:32 AM

## 2020-06-02 NOTE — Progress Notes (Signed)
Wrapped leg in Ace wrap and kept elevated all day on 3 pillows except when oob for meals.

## 2020-06-03 DIAGNOSIS — L03116 Cellulitis of left lower limb: Secondary | ICD-10-CM | POA: Diagnosis not present

## 2020-06-03 DIAGNOSIS — E872 Acidosis: Secondary | ICD-10-CM | POA: Diagnosis not present

## 2020-06-03 DIAGNOSIS — E119 Type 2 diabetes mellitus without complications: Secondary | ICD-10-CM | POA: Diagnosis not present

## 2020-06-03 LAB — BASIC METABOLIC PANEL
Anion gap: 9 (ref 5–15)
BUN: 13 mg/dL (ref 8–23)
CO2: 23 mmol/L (ref 22–32)
Calcium: 9.1 mg/dL (ref 8.9–10.3)
Chloride: 109 mmol/L (ref 98–111)
Creatinine, Ser: 0.74 mg/dL (ref 0.61–1.24)
GFR, Estimated: >60 mL/min (ref >60)
Glucose, Bld: 149 mg/dL — ABNORMAL HIGH (ref 70–99)
Potassium: 4.3 mmol/L (ref 3.5–5.1)
Sodium: 141 mmol/L (ref 135–145)

## 2020-06-03 LAB — METHYLMALONIC ACID, SERUM: Methylmalonic Acid, Quantitative: 1223 nmol/L — ABNORMAL HIGH (ref 0–378)

## 2020-06-03 LAB — GLUCOSE, CAPILLARY
Glucose-Capillary: 180 mg/dL — ABNORMAL HIGH (ref 70–99)
Glucose-Capillary: 163 mg/dL — ABNORMAL HIGH (ref 70–99)
Glucose-Capillary: 180 mg/dL — ABNORMAL HIGH (ref 70–99)

## 2020-06-03 MED ORDER — SODIUM CHLORIDE 0.9 % IV SOLN
INTRAVENOUS | Status: DC | PRN
  Administered 2020-06-03: 250 mL via INTRAVENOUS

## 2020-06-03 NOTE — Evaluation (Signed)
Physical Therapy Evaluation Patient Details Name: Jordan Drake MRN: [id number] DOB: 12-Jun-1940 Today's Date: 06/03/2020   History of Present Illness  [name] is an 12yoM who comes to [facility] on 5/20 c LLE pain continued swelling. Pt was DC from here 5/18 after admission for severe sepsis due to left lower extremity cellulitis. PMH: HTN, DM2, CAD s/p CABG.  Clinical Impression  Pt admitted with above diagnosis. Pt currently with functional limitations due to the deficits listed below (see "PT Problem List"). Patient agreeable to PT evaluation. Pt familiar to our services from admission last week. Patient provides detailed description of PLOF and home environment. Pt able to AMB fully around unit, much farther than tolerated last admission. Pt reports good mobility after DC last time. Pt AMB in room to BR ad lib, agreeable to RW out of room for safety. Patient's assessment this date reveals the patient requires an additional person present for safety and/or physical assistance to complete their typical ADL. At baseline, the patient is able to perform ADL with modified independence. Patient will benefit from skilled PT intervention to maximize independence and safety in mobility required for basic ADL performance at discharge.       Follow Up Recommendations Home health PT;Supervision - Intermittent    Equipment Recommendations  None recommended by PT    Recommendations for Other Services       Precautions / Restrictions Precautions Precautions: Fall Restrictions Weight Bearing Restrictions: No      Mobility  Bed Mobility               General bed mobility comments: NT, pt in recliner    Transfers   Equipment used: None Transfers: Sit to/from Stand Sit to Stand: Supervision            Ambulation/Gait Ambulation/Gait assistance: Supervision Gait Distance (Feet): 190 Feet Assistive device: Rolling walker (2 wheeled) Gait Pattern/deviations: WFL(Within Functional  Limits)     General Gait Details: 0.92m/s  Stairs            Wheelchair Mobility    Modified Rankin (Stroke Patients Only)       Balance Overall balance assessment: Modified Independent;Mild deficits observed, not formally tested                                           Pertinent Vitals/Pain Pain Assessment: 0-10 Pain Score: 7  Pain Location: L ankle Pain Intervention(s): Limited activity within patient's tolerance    Home Living Family/patient expects to be discharged to:: Private residence Living Arrangements: Spouse/significant other Available Help at Discharge: Available 24 hours/day Type of Home: House Home Access: Level entry;Other (comment)     Home Layout: One level Home Equipment: Cane - single point;Wheelchair - manual;Shower seat;Grab bars - tub/shower;Grab bars - toilet;Hand held shower head;Bedside commode;Walker - 4 wheels;Walker - 2 wheels Additional Comments: Pt has a standard size RW, would benefit from a bariatric RW    Prior Function Level of Independence: Independent with assistive device(s)         Comments: Mod Ind amb with a SPC for the last 6 months and with two SPCs more recently; 2 falls in the last 6 months; Ind with ADLs     Hand Dominance   Dominant Hand: Right    Extremity/Trunk Assessment                Communication  Communication: No difficulties  Cognition Arousal/Alertness: Awake/alert Behavior During Therapy: WFL for tasks assessed/performed Overall Cognitive Status: Within Functional Limits for tasks assessed                                 General Comments: Pt is pleasant and cooperative. A & O x4      General Comments      Exercises     Assessment/Plan    PT Assessment Patient needs continued PT services  PT Problem List Decreased strength;Decreased range of motion;Decreased activity tolerance;Decreased balance;Decreased mobility;Decreased knowledge of use of  DME       PT Treatment Interventions DME instruction;Gait training;Stair training;Functional mobility training;Therapeutic activities;Therapeutic exercise;Balance training;Patient/family education    PT Goals (Current goals can be found in the Care Plan section)  Acute Rehab PT Goals Patient Stated Goal: To lose weight and be more active PT Goal Formulation: With patient Time For Goal Achievement: 06/17/20 Potential to Achieve Goals: Good    Frequency Min 2X/week   Barriers to discharge Inaccessible home environment      Co-evaluation               AM-PAC PT "6 Clicks" Mobility  Outcome Measure Help needed turning from your back to your side while in a flat bed without using bedrails?: A Little Help needed moving from lying on your back to sitting on the side of a flat bed without using bedrails?: A Little Help needed moving to and from a bed to a chair (including a wheelchair)?: None Help needed standing up from a chair using your arms (e.g., wheelchair or bedside chair)?: None Help needed to walk in hospital room?: None Help needed climbing 3-5 steps with a railing? : A Little 6 Click Score: 21    End of Session Equipment Utilized During Treatment: Gait belt Activity Tolerance: Patient tolerated treatment well;No increased pain;Patient limited by pain Patient left: in chair;with call bell/phone within reach Nurse Communication: Mobility status PT Visit Diagnosis: Unsteadiness on feet (R26.81);History of falling (Z91.81);Difficulty in walking, not elsewhere classified (R26.2);Pain;Muscle weakness (generalized) (M62.81) Pain - Right/Left: Left Pain - part of body: Ankle and joints of foot    Time: 1415-1426 PT Time Calculation (min) (ACUTE ONLY): 11 min   Charges:   PT Evaluation $PT Eval Low Complexity: 1 Low          2:31 PM, 06/03/20 Etta Grandchild, PT, DPT Physical Therapist - [facility]  [phone] ([place])     Harrison C 06/03/2020, 2:29 PM

## 2020-06-03 NOTE — Progress Notes (Signed)
PROGRESS NOTE    Jordan Drake  IZT:[id number] DOB: 08/12/1940 DOA: 06/01/2020 PCP: Prince Solian, MD   Chief complaint.  Left leg pain and redness. Brief Narrative:  Jordan Drake Jordan Drake a 80 y.o.malewith medical history significant ofhypertension, hyperlipidemia, diabetes mellitus, gout, CAD, CABG, CHF, who presents with left lower leg pain.  He was started on vancomycin and Rocephin.   Assessment & Plan:   Principal Problem:   Cellulitis of left lower extremity Active Problems:   Lactic acidosis   Essential hypertension   Diabetes mellitus without complication (HCC)   Coronary artery disease   Chronic diastolic CHF (congestive heart failure) (HCC)   HLD (hyperlipidemia)   Gout  #1.  Left lower extremity cellulitis. Patient failed outpatient treatment.  Blood culture negative in 48 hours. We will continue current antibiotics with Rocephin and vancomycin.  Patient will benefit for another day of antibiotics, left lower extremity still red and tender.  But has made significant progress since admission.  Most likely will be able to discharge tomorrow.  2.  Lactic acidosis secondary to metformin. Will hold metformin at the time of discharge.  3.  Essential hypertension. Continue current treatment.  4.  Type 2 diabetes. Continue sliding scale insulin.    DVT prophylaxis: Lovenox Code Status: Full Family Communication: Son at the bedside. Disposition Plan:  .   Status is: Inpatient  Remains inpatient appropriate because:Inpatient level of care appropriate due to severity of illness   Dispo: The patient is from: Home              Anticipated d/c is to: Home              Patient currently is not medically stable to d/c.   Difficult to place patient No        I/O last 3 completed shifts: In: 1880.5 [P.O.:960; I.V.:420.5; IV Piggyback:500] Out: 500 [Urine:500] No intake/output data recorded.     Consultants:   None  Procedures:  None  Antimicrobials:  Rocephin and vancomycin.  Subjective: Patient still has some redness and tenderness in the leg, but he much improved compared to time and when he came in. Does not have any short of breath or cough. No abdominal pain or nausea vomiting. No fever or chills.  Objective: Vitals:   06/02/20 1709 06/02/20 2138 06/03/20 0537 06/03/20 0838  BP: (!) 159/76 (!) 139/58 (!) 159/72 (!) 150/53  Pulse: 90 66 60 (!) 57  Resp: 18 17 18 18   Temp: 98.1 F (36.7 C) 98.1 F (36.7 C) 97.9 F (36.6 C) 98 F (36.7 C)  TempSrc: Oral     SpO2: 95% 96% 98% 98%  Weight:      Height:        Intake/Output Summary (Last 24 hours) at 06/03/2020 1017 Last data filed at 06/03/2020 0400 Gross per 24 hour  Intake 1220 ml  Output 500 ml  Net 720 ml   Filed Weights   06/01/20 1457  Weight: 131.8 kg    Examination:  General exam: Appears calm and comfortable  Respiratory system: Clear to auscultation. Respiratory effort normal. Cardiovascular system: S1 & S2 heard, RRR. No JVD, murmurs, rubs, gallops or clicks. No pedal edema. Gastrointestinal system: Abdomen is nondistended, soft and nontender. No organomegaly or masses felt. Normal bowel sounds heard. Central nervous system: Alert and oriented. No focal neurological deficits. Extremities: Left leg still red, less tender, Skin: No rashes, lesions or ulcers Psychiatry: Judgement and insight appear normal. Mood & affect appropriate.  Data Reviewed: I have personally reviewed following labs and imaging studies  CBC: Recent Labs  Lab 05/28/20 1721 05/29/20 0545 05/30/20 0558 06/01/20 1507 06/02/20 0543  WBC 23.3* 18.1* 13.6* 11.1* 10.3  NEUTROABS 20.8* 14.3*  --  6.7  --   HGB 14.5 14.3 14.5 15.2 14.7  HCT 42.9 41.4 41.9 44.4 42.9  MCV 95.3 94.5 93.7 92.5 93.9  PLT 175 158 165 232 824   Basic Metabolic Panel: Recent Labs  Lab 05/28/20 1721 05/29/20 0545 05/30/20 0558 06/01/20 1507 06/02/20 0543  06/03/20 0514  NA 139 137 134* 137 140 141  K 4.7 4.3 4.5 4.1 4.1 4.3  CL 103 102 103 105 108 109  CO2 22 24 21* 20* 22 23  GLUCOSE 226* 142* 172* 148* 127* 149*  BUN 27* 27* 19 15 13 13   CREATININE 1.38* 1.02 0.68 0.69 0.79 0.74  CALCIUM 8.9 8.4* 8.5* 9.2 8.9 9.1  MG 1.6* 2.1  --   --   --   --   PHOS  --  3.8  --   --   --   --    GFR: Estimated Creatinine Clearance: 97.4 mL/min (by C-G formula based on SCr of 0.74 mg/dL). Liver Function Tests: Recent Labs  Lab 05/28/20 1721 05/29/20 0545 06/01/20 1507  AST 67* 78* 65*  ALT 32 30 40  ALKPHOS 37* 36* 60  BILITOT 1.0 1.0 1.2  PROT 7.3 6.8 7.9  ALBUMIN 3.7 3.3* 3.6   No results for input(s): LIPASE, AMYLASE in the last 168 hours. No results for input(s): AMMONIA in the last 168 hours. Coagulation Profile: Recent Labs  Lab 05/29/20 0545  INR 1.3*   Cardiac Enzymes: Recent Labs  Lab 05/28/20 2005  CKTOTAL 1,886*   BNP (last 3 results) No results for input(s): PROBNP in the last 8760 hours. HbA1C: No results for input(s): HGBA1C in the last 72 hours. CBG: Recent Labs  Lab 06/01/20 2200 06/02/20 0738 06/02/20 1057 06/02/20 1628 06/02/20 2143  GLUCAP 178* 129* 197* 146* 153*   Lipid Profile: No results for input(s): CHOL, HDL, LDLCALC, TRIG, CHOLHDL, LDLDIRECT in the last 72 hours. Thyroid Function Tests: No results for input(s): TSH, T4TOTAL, FREET4, T3FREE, THYROIDAB in the last 72 hours. Anemia Panel: No results for input(s): VITAMINB12, FOLATE, FERRITIN, TIBC, IRON, RETICCTPCT in the last 72 hours. Sepsis Labs: Recent Labs  Lab 05/28/20 2014 05/29/20 0545 06/01/20 1507 06/01/20 1843  LATICACIDVEN 6.0* 2.3* 2.3* 2.4*    Recent Results (from the past 240 hour(s))  Resp Panel by RT-PCR (Flu A&B, Covid) Nasopharyngeal Swab     Status: None   Collection Time: 05/28/20  4:58 PM   Specimen: Nasopharyngeal Swab; Nasopharyngeal(NP) swabs in vial transport medium  Result Value Ref Range Status   SARS  Coronavirus 2 by RT PCR NEGATIVE NEGATIVE Final    Comment: (NOTE) SARS-CoV-2 target nucleic acids are NOT DETECTED.  The SARS-CoV-2 RNA is generally detectable in upper respiratory specimens during the acute phase of infection. The lowest concentration of SARS-CoV-2 viral copies this assay can detect is 138 copies/mL. A negative result does not preclude SARS-Cov-2 infection and should not be used as the sole basis for treatment or other patient management decisions. A negative result may occur with  improper specimen collection/handling, submission of specimen other than nasopharyngeal swab, presence of viral mutation(s) within the areas targeted by this assay, and inadequate number of viral copies(<138 copies/mL). A negative result must be combined with clinical observations, patient history, and epidemiological  information. The expected result is Negative.  Fact Sheet for Patients:  EntrepreneurPulse.com.au  Fact Sheet for Healthcare Providers:  IncredibleEmployment.be  This test is no t yet approved or cleared by the [place] FDA and  has been authorized for detection and/or diagnosis of SARS-CoV-2 by FDA under an Emergency Use Authorization (EUA). This EUA will remain  in effect (meaning this test can be used) for the duration of the COVID-19 declaration under Section 564(b)(1) of the Act, 21 U.S.C.section 360bbb-3(b)(1), unless the authorization is terminated  or revoked sooner.       Influenza A by PCR NEGATIVE NEGATIVE Final   Influenza B by PCR NEGATIVE NEGATIVE Final    Comment: (NOTE) The Xpert Xpress SARS-CoV-2/FLU/RSV plus assay is intended as an aid in the diagnosis of influenza from Nasopharyngeal swab specimens and should not be used as a sole basis for treatment. Nasal washings and aspirates are unacceptable for Xpert Xpress SARS-CoV-2/FLU/RSV testing.  Fact Sheet for  Patients: EntrepreneurPulse.com.au  Fact Sheet for Healthcare Providers: IncredibleEmployment.be  This test is not yet approved or cleared by the [place] FDA and has been authorized for detection and/or diagnosis of SARS-CoV-2 by FDA under an Emergency Use Authorization (EUA). This EUA will remain in effect (meaning this test can be used) for the duration of the COVID-19 declaration under Section 564(b)(1) of the Act, 21 U.S.C. section 360bbb-3(b)(1), unless the authorization is terminated or revoked.  Performed at [facility], [address] [place], [place] [address]   Culture, blood (Routine X 2) w Reflex to ID Panel     Status: None   Collection Time: 05/28/20  8:01 PM   Specimen: BLOOD  Result Value Ref Range Status   Specimen Description BLOOD BLOOD RIGHT HAND  Final   Special Requests   Final    BOTTLES DRAWN AEROBIC AND ANAEROBIC Blood Culture results may not be optimal due to an inadequate volume of blood received in culture bottles   Culture   Final    NO GROWTH 5 DAYS Performed at [facility], [address] [place], [place] [address]    Report Status 06/02/2020 FINAL  Final  Culture, blood (Routine X 2) w Reflex to ID Panel     Status: None   Collection Time: 05/28/20  8:14 PM   Specimen: BLOOD  Result Value Ref Range Status   Specimen Description BLOOD BLOOD LEFT HAND  Final   Special Requests   Final    BOTTLES DRAWN AEROBIC AND ANAEROBIC Blood Culture results may not be optimal due to an inadequate volume of blood received in culture bottles   Culture   Final    NO GROWTH 5 DAYS Performed at [facility], [address] [place], [place] [address]    Report Status 06/02/2020 FINAL  Final  MRSA PCR Screening     Status: None   Collection Time: 05/29/20  4:23 PM   Specimen: Nasopharyngeal  Result Value Ref Range Status   MRSA by PCR NEGATIVE NEGATIVE Final    Comment:        The  GeneXpert MRSA Assay (FDA approved for NASAL specimens only), is one component of a comprehensive MRSA colonization surveillance program. It is not intended to diagnose MRSA infection nor to guide or monitor treatment for MRSA infections. Performed at [facility], [address] [place], [place] [address]   Resp Panel by RT-PCR (Flu A&B, Covid) Nasopharyngeal Swab     Status: None   Collection Time:  06/01/20  4:10 PM   Specimen: Nasopharyngeal Swab; Nasopharyngeal(NP) swabs in vial transport medium  Result Value Ref Range Status   SARS Coronavirus 2 by RT PCR NEGATIVE NEGATIVE Final    Comment: (NOTE) SARS-CoV-2 target nucleic acids are NOT DETECTED.  The SARS-CoV-2 RNA is generally detectable in upper respiratory specimens during the acute phase of infection. The lowest concentration of SARS-CoV-2 viral copies this assay can detect is 138 copies/mL. A negative result does not preclude SARS-Cov-2 infection and should not be used as the sole basis for treatment or other patient management decisions. A negative result may occur with  improper specimen collection/handling, submission of specimen other than nasopharyngeal swab, presence of viral mutation(s) within the areas targeted by this assay, and inadequate number of viral copies(<138 copies/mL). A negative result must be combined with clinical observations, patient history, and epidemiological information. The expected result is Negative.  Fact Sheet for Patients:  BloggerCourse.com  Fact Sheet for Healthcare Providers:  SeriousBroker.it  This test is no t yet approved or cleared by the [place] FDA and  has been authorized for detection and/or diagnosis of SARS-CoV-2 by FDA under an Emergency Use Authorization (EUA). This EUA will remain  in effect (meaning this test can be used) for the duration of the COVID-19 declaration under Section 564(b)(1) of the  Act, 21 U.S.C.section 360bbb-3(b)(1), unless the authorization is terminated  or revoked sooner.       Influenza A by PCR NEGATIVE NEGATIVE Final   Influenza B by PCR NEGATIVE NEGATIVE Final    Comment: (NOTE) The Xpert Xpress SARS-CoV-2/FLU/RSV plus assay is intended as an aid in the diagnosis of influenza from Nasopharyngeal swab specimens and should not be used as a sole basis for treatment. Nasal washings and aspirates are unacceptable for Xpert Xpress SARS-CoV-2/FLU/RSV testing.  Fact Sheet for Patients: BloggerCourse.com  Fact Sheet for Healthcare Providers: SeriousBroker.it  This test is not yet approved or cleared by the [place] FDA and has been authorized for detection and/or diagnosis of SARS-CoV-2 by FDA under an Emergency Use Authorization (EUA). This EUA will remain in effect (meaning this test can be used) for the duration of the COVID-19 declaration under Section 564(b)(1) of the Act, 21 U.S.C. section 360bbb-3(b)(1), unless the authorization is terminated or revoked.  Performed at [facility], [address] [place], [place] [address]   Blood culture (routine x 2)     Status: None (Preliminary result)   Collection Time: 06/01/20  4:10 PM   Specimen: BLOOD  Result Value Ref Range Status   Specimen Description BLOOD RIGHT ANTECUBITAL  Final   Special Requests   Final    BOTTLES DRAWN AEROBIC AND ANAEROBIC Blood Culture results may not be optimal due to an inadequate volume of blood received in culture bottles   Culture   Final    NO GROWTH 2 DAYS Performed at [facility], [address], [place], [place] [address]    Report Status PENDING  Incomplete  Blood culture (routine x 2)     Status: None (Preliminary result)   Collection Time: 06/01/20  4:10 PM   Specimen: BLOOD  Result Value Ref Range Status   Specimen Description BLOOD LEFT ANTECUBITAL  Final   Special Requests    Final    BOTTLES DRAWN AEROBIC AND ANAEROBIC Blood Culture adequate volume   Culture   Final    NO GROWTH 2 DAYS Performed at [facility], [address], [place], [place] [address]  Report Status PENDING  Incomplete         Radiology Studies: DG Foot Complete Left  Result Date: 06/01/2020 CLINICAL DATA:  Worsening left lower extremity cellulitis. EXAM: LEFT FOOT - COMPLETE 3+ VIEW COMPARISON:  None. FINDINGS: There is no evidence of an acute fracture or dislocation. Periarticular osteopenia is seen involving the metatarsophalangeal articulations of the second through fifth left toes. Mild to moderate severity degenerative changes are seen along the dorsal aspect of the mid left foot. There is marked severity vascular calcification with marked severity diffuse soft tissue swelling. This is most prominent along the dorsal aspect of the left ankle and left foot. IMPRESSION: Marked severity diffuse soft tissue swelling which is likely, in part, secondary to a combination of diffuse cellulitis and the patient's body habitus. Electronically Signed   By: Virgina Norfolk M.D.   On: 06/01/2020 15:49        Scheduled Meds: . acidophilus  2 capsule Oral Daily  . amLODipine  10 mg Oral Daily   And  . benazepril  20 mg Oral Daily  . aspirin EC  81 mg Oral Daily  . colchicine  0.6 mg Oral BID  . enoxaparin (LOVENOX) injection  0.5 mg/kg Subcutaneous Q24H  . insulin aspart  0-5 Units Subcutaneous QHS  . insulin aspart  0-9 Units Subcutaneous TID WC  . magic mouthwash w/lidocaine  10 mL Oral QID  . metoprolol succinate  50 mg Oral Daily  . simvastatin  20 mg Oral Daily   Continuous Infusions: . cefTRIAXone (ROCEPHIN)  IV 2 g (06/02/20 1529)  . vancomycin 2,000 mg (06/02/20 2205)     LOS: 2 days    Time spent: 27 minutes    Sharen Hones, MD [facility]   To contact the attending provider between 7A-7P or the covering provider during after hours 7P-7A, please  log into the web site [email or web address] and access using universal [place] password for that web site. If you do not have the password, please call the hospital operator.  06/03/2020, 10:17 AM

## 2020-06-03 NOTE — Plan of Care (Signed)
During the night shift, patient ambulated to the bathroom with one assist 1-2x times during the night.  Patient tolerated Vancomycin with no complaints of discomfort. Right before bedtime, patient received pain med around 2151 and was effective. Educated patient to use call bell prior to getting up for safety precautions. Will continue to monitor patient to end of the shift.   Problem: Education: Goal: Knowledge of General Education information will improve Description: Including pain rating scale, medication(s)/side effects and non-pharmacologic comfort measures Outcome: Progressing   Problem: Health Behavior/Discharge Planning: Goal: Ability to manage health-related needs will improve Outcome: Progressing   Problem: Clinical Measurements: Goal: Ability to maintain clinical measurements within normal limits will improve Outcome: Progressing Goal: Will remain free from infection Outcome: Progressing Goal: Diagnostic test results will improve Outcome: Progressing Goal: Respiratory complications will improve Outcome: Progressing Goal: Cardiovascular complication will be avoided Outcome: Progressing   Problem: Activity: Goal: Risk for activity intolerance will decrease Outcome: Progressing   Problem: Nutrition: Goal: Adequate nutrition will be maintained Outcome: Progressing  Problem: Coping: Goal: Level of anxiety will decrease Outcome: Progressing   Problem: Elimination: Goal: Will not experience complications related to bowel motility Outcome: Progressing Goal: Will not experience complications related to urinary retention Outcome: Progressing   Problem: Pain Managment: Goal: General experience of comfort will improve Outcome: Progressing   Problem: Safety: Goal: Ability to remain free from injury will improve Outcome: Progressing   Problem: Skin Integrity: Goal: Risk for impaired skin integrity will decrease Outcome: Progressing

## 2020-06-04 DIAGNOSIS — I10 Essential (primary) hypertension: Secondary | ICD-10-CM | POA: Diagnosis not present

## 2020-06-04 DIAGNOSIS — E872 Acidosis: Secondary | ICD-10-CM | POA: Diagnosis not present

## 2020-06-04 DIAGNOSIS — I5032 Chronic diastolic (congestive) heart failure: Secondary | ICD-10-CM | POA: Diagnosis not present

## 2020-06-04 DIAGNOSIS — L03116 Cellulitis of left lower limb: Secondary | ICD-10-CM | POA: Diagnosis not present

## 2020-06-04 LAB — GLUCOSE, CAPILLARY
Glucose-Capillary: 168 mg/dL — ABNORMAL HIGH (ref 70–99)
Glucose-Capillary: 163 mg/dL — ABNORMAL HIGH (ref 70–99)

## 2020-06-04 MED ORDER — AMOXICILLIN-POT CLAVULANATE 875-125 MG PO TABS: 1.0000 | ORAL_TABLET | Freq: Two times a day (BID) | ORAL | 0 refills | Status: AC

## 2020-06-04 MED ORDER — DOXYCYCLINE MONOHYDRATE 100 MG PO TABS: 100.0000 mg | ORAL_TABLET | Freq: Two times a day (BID) | ORAL | 0 refills | Status: AC

## 2020-06-04 MED ORDER — RISAQUAD PO CAPS: 2.0000 | ORAL_CAPSULE | Freq: Every day | ORAL | 0 refills | Status: AC

## 2020-06-04 NOTE — Care Management Important Message (Signed)
Important Message  Patient Details  Name: Jordan Drake MRN: [id number] Date of Birth: Dec 07, 1940   Medicare Important Message Given:  Yes     Dannette Barbara 06/04/2020, 12:07 PM

## 2020-06-04 NOTE — Discharge Summary (Signed)
Physician Discharge Summary  Patient ID: Jordan Drake MRN: [id number] DOB/AGE: 1940/11/14 80 y.o.  Admit date: 06/01/2020 Discharge date: 06/04/2020  Admission Diagnoses:  Discharge Diagnoses:  Principal Problem:   Cellulitis of left lower extremity Active Problems:   Lactic acidosis   Essential hypertension   Diabetes mellitus without complication (HCC)   Coronary artery disease   Chronic diastolic CHF (congestive heart failure) (HCC)   HLD (hyperlipidemia)   Gout Sepsis ruled out  Discharged Condition: good  Hospital Course:   Jordan Bolz Cableis a 80 y.o.malewith medical history significant ofhypertension, hyperlipidemia, diabetes mellitus, gout, CAD, CABG, CHF, who presents with left lower leg pain.He was started on vancomycin and Rocephin.  #1.  Left lower extremity cellulitis. Patient failed outpatient treatment.    He has been treated with vancomycin and Rocephin for 3 days, blood culture negative. Much improved, at this point, he is medically stable to be discharged.  I will continue 7 days antibiotics with Augmentin and doxycycline. Patient is advised to follow-up with PCP in 1 week.  2.  Lactic acidosis secondary to metformin. Metformin discontinued.  3.  Essential hypertension. Continue current treatment.  4.  Type 2 diabetes. Resumed home dose glimepiride.  Metformin on hold.  Consults: None  Significant Diagnostic Studies:  LEFT FOOT - COMPLETE 3+ VIEW  COMPARISON:  None.  FINDINGS: There is no evidence of an acute fracture or dislocation. Periarticular osteopenia is seen involving the metatarsophalangeal articulations of the second through fifth left toes. Mild to moderate severity degenerative changes are seen along the dorsal aspect of the mid left foot. There is marked severity vascular calcification with marked severity diffuse soft tissue swelling. This is most prominent along the dorsal aspect of the left ankle and left  foot.  IMPRESSION: Marked severity diffuse soft tissue swelling which is likely, in part, secondary to a combination of diffuse cellulitis and the patient's body habitus.   Electronically Signed   By: Virgina Norfolk M.D.   On: 06/01/2020 15:49  LEFT LOWER EXTREMITY VENOUS DOPPLER ULTRASOUND  TECHNIQUE: Gray-scale sonography with graded compression, as well as color Doppler and duplex ultrasound were performed to evaluate the lower extremity deep venous systems from the level of the common femoral vein and including the common femoral, femoral, profunda femoral, popliteal and calf veins including the posterior tibial, peroneal and gastrocnemius veins when visible. The superficial great saphenous vein was also interrogated. Spectral Doppler was utilized to evaluate flow at rest and with distal augmentation maneuvers in the common femoral, femoral and popliteal veins.  COMPARISON:  None.  FINDINGS: Contralateral Common Femoral Vein: Respiratory phasicity is normal and symmetric with the symptomatic side. No evidence of thrombus. Normal compressibility.  Common Femoral Vein: No evidence of thrombus. Normal compressibility, respiratory phasicity and response to augmentation.  Saphenofemoral Junction: No evidence of thrombus. Normal compressibility and flow on color Doppler imaging.  Profunda Femoral Vein: No evidence of thrombus. Normal compressibility and flow on color Doppler imaging.  Femoral Vein: No evidence of thrombus. Normal compressibility, respiratory phasicity and response to augmentation.  Popliteal Vein: No evidence of thrombus. Normal compressibility, respiratory phasicity and response to augmentation.  Calf Veins: Visualized left deep calf veins are patent without thrombus. Limited evaluation.  Other Findings:  None.  IMPRESSION: Negative for deep venous thrombosis in left lower extremity. Limited evaluation of the left deep calf  veins.   Electronically Signed   By: Markus Daft M.D.   On: 05/28/2020 18:20  Treatments: Rocephin and vancomycin.  Discharge  Exam: Blood pressure (!) 154/67, pulse 62, temperature 98.7 F (37.1 C), temperature source Oral, resp. rate 16, height 5\' 9"  (1.753 m), weight 131.8 kg, SpO2 98 %. General appearance: alert and cooperative Resp: clear to auscultation bilaterally Cardio: regular rate and rhythm, S1, S2 normal, no murmur, click, rub or gallop GI: soft, non-tender; bowel sounds normal; no masses,  no organomegaly Extremities: Left leg swelling and redness much improved.  Skin temperature normal.  Disposition: Discharge disposition: 01-Home or Self Care       Discharge Instructions    Diet - low sodium heart healthy   Complete by: As directed    Increase activity slowly   Complete by: As directed      Allergies as of 06/04/2020   No Known Allergies     Medication List    STOP taking these medications   cefdinir 300 MG capsule Commonly known as: OMNICEF   metFORMIN 500 MG tablet Commonly known as: GLUCOPHAGE     TAKE these medications   acidophilus Caps capsule Take 2 capsules by mouth daily for 15 days. Start taking on: Jun 05, 2020   amLODipine-benazepril 10-20 MG capsule Commonly known as: LOTREL Take 1 capsule by mouth daily.   amoxicillin-clavulanate 875-125 MG tablet Commonly known as: Augmentin Take 1 tablet by mouth 2 (two) times daily for 7 days.   aspirin EC 81 MG tablet Take 81 mg by mouth daily.   colchicine 0.6 MG tablet Take 1 tablet (0.6 mg total) by mouth 2 (two) times daily.   cyanocobalamin 1000 MCG/ML injection Commonly known as: (VITAMIN B-12) Inject 1 mL (1,000 mcg total) into the muscle every 7 (seven) days.   doxycycline 100 MG tablet Commonly known as: ADOXA Take 1 tablet (100 mg total) by mouth 2 (two) times daily for 7 days.   glimepiride 1 MG tablet Commonly known as: AMARYL Take 1 mg by mouth every morning.    HYDROcodone-acetaminophen 5-325 MG tablet Commonly known as: NORCO/VICODIN Take 1 tablet by mouth every 6 (six) hours as needed for moderate pain.   metoprolol succinate 50 MG 24 hr tablet Commonly known as: TOPROL-XL Take 1 tablet (50 mg total) by mouth daily. Take with or immediately following a meal.   nitroGLYCERIN 0.4 MG SL tablet Commonly known as: NITROSTAT Place 0.4 mg under the tongue every 5 (five) minutes as needed for chest pain.   simvastatin 20 MG tablet Commonly known as: ZOCOR Take 20 mg by mouth daily.       Follow-up Information    Avva, Ravisankar, MD Follow up in 1 week(s).   Specialty: Internal Medicine Contact information: [address] [place] [address] [phone]               Signed: Sharen Hones 06/04/2020, 10:00 AM

## 2020-06-04 NOTE — Plan of Care (Signed)

## 2020-06-04 NOTE — TOC Progression Note (Signed)
Transition of Care [facility]) - Progression Note    Patient Details  Name: Jordan Drake MRN: [id number] Date of Birth: 06-04-1940  Transition of Care [facility]) CM/SW [place], RN Phone Number: 06/04/2020, 10:18 AM  Clinical Narrative:   Met with the patient and his son in the room to discuss DC plan and needs, He lives at home with his spouse and has [facility] already set [facility], he stated that a nurse saw home the Friday before coming into the hospital, he has a RW, Rollator, WC, 3 in 1, Shower seat, grab bars at home and does not need additional DME, He has transportation with his son in a [place], No needs at this time         Expected Discharge Plan and Services           Expected Discharge Date: 06/04/20                                     Social Determinants of Health (SDOH) Interventions    Readmission Risk Interventions No flowsheet data found.

## 2020-06-06 LAB — CULTURE, BLOOD (ROUTINE X 2)
Culture: NO GROWTH
Culture: NO GROWTH
Special Requests: ADEQUATE

## 2020-06-13 DIAGNOSIS — E538 Deficiency of other specified B group vitamins: Secondary | ICD-10-CM | POA: Diagnosis not present

## 2020-06-13 DIAGNOSIS — I10 Essential (primary) hypertension: Secondary | ICD-10-CM | POA: Diagnosis not present

## 2020-06-13 DIAGNOSIS — E1151 Type 2 diabetes mellitus with diabetic peripheral angiopathy without gangrene: Secondary | ICD-10-CM | POA: Diagnosis not present

## 2020-06-13 DIAGNOSIS — M79671 Pain in right foot: Secondary | ICD-10-CM | POA: Diagnosis not present

## 2020-06-13 DIAGNOSIS — M10072 Idiopathic gout, left ankle and foot: Secondary | ICD-10-CM | POA: Diagnosis not present

## 2020-06-13 DIAGNOSIS — B372 Candidiasis of skin and nail: Secondary | ICD-10-CM | POA: Diagnosis not present

## 2020-06-13 DIAGNOSIS — A419 Sepsis, unspecified organism: Secondary | ICD-10-CM | POA: Diagnosis not present

## 2020-06-13 DIAGNOSIS — L03116 Cellulitis of left lower limb: Secondary | ICD-10-CM | POA: Diagnosis not present

## 2020-06-13 DIAGNOSIS — M79672 Pain in left foot: Secondary | ICD-10-CM | POA: Diagnosis not present

## 2020-06-13 DIAGNOSIS — R6520 Severe sepsis without septic shock: Secondary | ICD-10-CM | POA: Diagnosis not present

## 2020-06-15 ENCOUNTER — Ambulatory Visit: Payer: Medicare Other | Admitting: Family

## 2020-06-20 DIAGNOSIS — M79672 Pain in left foot: Secondary | ICD-10-CM | POA: Diagnosis not present

## 2020-06-20 DIAGNOSIS — E538 Deficiency of other specified B group vitamins: Secondary | ICD-10-CM | POA: Diagnosis not present

## 2020-06-20 DIAGNOSIS — I739 Peripheral vascular disease, unspecified: Secondary | ICD-10-CM | POA: Diagnosis not present

## 2020-06-20 DIAGNOSIS — E1151 Type 2 diabetes mellitus with diabetic peripheral angiopathy without gangrene: Secondary | ICD-10-CM | POA: Diagnosis not present

## 2020-06-20 DIAGNOSIS — M79671 Pain in right foot: Secondary | ICD-10-CM | POA: Diagnosis not present

## 2020-06-20 DIAGNOSIS — F1120 Opioid dependence, uncomplicated: Secondary | ICD-10-CM | POA: Diagnosis not present

## 2020-06-20 DIAGNOSIS — L03116 Cellulitis of left lower limb: Secondary | ICD-10-CM | POA: Diagnosis not present

## 2020-06-20 DIAGNOSIS — M10072 Idiopathic gout, left ankle and foot: Secondary | ICD-10-CM | POA: Diagnosis not present

## 2020-06-21 ENCOUNTER — Other Ambulatory Visit (HOSPITAL_COMMUNITY): Payer: Self-pay | Admitting: Adult Health

## 2020-06-21 ENCOUNTER — Ambulatory Visit (HOSPITAL_COMMUNITY): Payer: PPO

## 2020-06-21 DIAGNOSIS — I739 Peripheral vascular disease, unspecified: Secondary | ICD-10-CM

## 2020-06-22 ENCOUNTER — Other Ambulatory Visit (HOSPITAL_COMMUNITY): Payer: Self-pay | Admitting: Internal Medicine

## 2020-06-22 ENCOUNTER — Other Ambulatory Visit: Payer: Self-pay

## 2020-06-22 ENCOUNTER — Ambulatory Visit (HOSPITAL_COMMUNITY)
Admission: RE | Admit: 2020-06-22 | Discharge: 2020-06-22 | Disposition: A | Discharge status: Home / Self Care | Payer: PPO | Source: Ambulatory Visit | Attending: Adult Health | Admitting: Adult Health

## 2020-06-22 DIAGNOSIS — I739 Peripheral vascular disease, unspecified: Secondary | ICD-10-CM | POA: Diagnosis not present

## 2020-06-22 DIAGNOSIS — M79672 Pain in left foot: Secondary | ICD-10-CM

## 2020-06-22 DIAGNOSIS — M7989 Other specified soft tissue disorders: Secondary | ICD-10-CM

## 2020-06-24 ENCOUNTER — Ambulatory Visit (HOSPITAL_COMMUNITY)
Admission: RE | Admit: 2020-06-24 | Discharge: 2020-06-24 | Disposition: A | Discharge status: Home / Self Care | Payer: PPO | Source: Ambulatory Visit | Attending: Internal Medicine | Admitting: Internal Medicine

## 2020-06-24 ENCOUNTER — Other Ambulatory Visit: Payer: Self-pay

## 2020-06-24 DIAGNOSIS — M79672 Pain in left foot: Secondary | ICD-10-CM | POA: Insufficient documentation

## 2020-06-24 DIAGNOSIS — M7989 Other specified soft tissue disorders: Secondary | ICD-10-CM | POA: Insufficient documentation

## 2020-06-24 DIAGNOSIS — L03116 Cellulitis of left lower limb: Secondary | ICD-10-CM | POA: Diagnosis not present

## 2020-06-24 MED ORDER — GADOBUTROL 1 MMOL/ML IV SOLN
10.0000 mL | Freq: Once | INTRAVENOUS | Status: AC | PRN
  Administered 2020-06-24: 10 mL via INTRAVENOUS

## 2020-07-04 DIAGNOSIS — M79672 Pain in left foot: Secondary | ICD-10-CM | POA: Diagnosis not present

## 2020-07-04 DIAGNOSIS — L03116 Cellulitis of left lower limb: Secondary | ICD-10-CM | POA: Diagnosis not present

## 2020-07-04 DIAGNOSIS — I10 Essential (primary) hypertension: Secondary | ICD-10-CM | POA: Diagnosis not present

## 2020-07-04 DIAGNOSIS — I739 Peripheral vascular disease, unspecified: Secondary | ICD-10-CM | POA: Diagnosis not present

## 2020-07-04 DIAGNOSIS — R0989 Other specified symptoms and signs involving the circulatory and respiratory systems: Secondary | ICD-10-CM | POA: Diagnosis not present

## 2020-07-04 DIAGNOSIS — F1120 Opioid dependence, uncomplicated: Secondary | ICD-10-CM | POA: Diagnosis not present

## 2020-07-09 ENCOUNTER — Other Ambulatory Visit (HOSPITAL_COMMUNITY): Payer: Self-pay

## 2020-07-09 ENCOUNTER — Encounter: Payer: Self-pay | Admitting: Surgery

## 2020-07-09 ENCOUNTER — Telehealth: Payer: Self-pay

## 2020-07-09 ENCOUNTER — Ambulatory Visit: Payer: PPO | Admitting: Internal Medicine

## 2020-07-09 ENCOUNTER — Other Ambulatory Visit: Payer: Self-pay

## 2020-07-09 ENCOUNTER — Ambulatory Visit: Payer: PPO | Admitting: Surgery

## 2020-07-09 VITALS — BP 134/57 | HR 56 | Temp 98.0°F | Resp 20 | Ht 69.0 in | Wt 274.0 lb

## 2020-07-09 VITALS — BP 165/70 | HR 62 | Temp 97.6°F | Wt 275.0 lb

## 2020-07-09 DIAGNOSIS — L03116 Cellulitis of left lower limb: Secondary | ICD-10-CM | POA: Diagnosis not present

## 2020-07-09 DIAGNOSIS — M7989 Other specified soft tissue disorders: Secondary | ICD-10-CM

## 2020-07-09 NOTE — Progress Notes (Signed)
[place] for Infectious Disease  Reason for Consult: cellulitis  Referring Provider: Reginold Agent, NP   HPI:    Jordan Drake is a 80 y.o. male with PMHx as below who presents to the clinic for left leg cellulitis.   Jordan Drake was referred by his primary care physician for further evaluation of left leg cellulitis.  He presents today with his wife and his son.  He has been admitted to the hospital on 2 occasions in the past 6 weeks due to this issue.  He was admitted initially 5/16-5/18 and treated with ceftriaxone during that time prior to being discharged with 3 additional days of cefdinir to complete therapy.  However, he was then readmitted 5/20-5/23 and treated with vancomycin and ceftriaxone x3 days before being discharged to complete Augmentin and doxycycline for 7 more days.  He presented for hospital follow-up with his primary care physician and was continued on oral antibiotics at that time due to lack of resolution.  He further continued to have lack of improvement and so an MRI was obtained on 6/12 which showed severe cellulitis, but no abscess or osteomyelitis.  Arterial Dopplers were also performed and, according to PCP notes, indicated a mild decrease in blood flow to the left foot, but not thought to be causing the severe pain that he was experiencing and it was felt that he should be able to heal his infection.  He was most recently seen again by his primary care on 6/22 and resumed on a 14-day course of doxycycline and cefdinir with referral to our clinic at that time.  His family reports that prior to this 6/22 appointment he had been off antibiotics for approximately 2 days with worsening before being restarted on antimicrobials.  They state he has essentially been on antibiotics this entire time and has had improvement since resumption of current doxycycline and cefdinir (now day #5).  He continues to experience left leg pain and redness.  He also endorses lower  extremity edema.  He reports sometimes his legs itch so much that he scratches until there is a break in the skin.  He has not experienced any fevers but does report chills.  He has not been wearing compression socks.  He had a consultation this morning as well with vascular surgery who felt that his ongoing cellulitis was likely due to unresolved infection with a significant contribution related to his lower extremity edema.  They are planning to get him fitted for Unna boots to help relieve some of his vascular congestion.   Patient's Medications  New Prescriptions   No medications on file  Previous Medications   ALLOPURINOL (ZYLOPRIM) 300 MG TABLET    Take 300 mg by mouth daily.   AMLODIPINE-BENAZEPRIL (LOTREL) 10-20 MG CAPSULE    Take 1 capsule by mouth daily.   ASCORBIC ACID (VITAMIN C) 1000 MG TABLET    Take 1 tablet by mouth daily.   ASPIRIN EC 81 MG TABLET    Take 81 mg by mouth daily.   CEFDINIR (OMNICEF) 300 MG CAPSULE    Take 300 mg by mouth 2 (two) times daily.   CHOLECALCIFEROL (VITAMIN D3) 125 MCG (5000 UT) CAPS    See admin instructions.   CICLOPIROX (LOPROX) 0.77 % CREAM    APPLY TO AFFECTED AREA TWICE A DAY   COLCHICINE 0.6 MG TABLET    Take 1 tablet (0.6 mg total) by mouth 2 (two) times daily.   CRANBERRY 250 MG  CAPS    4200mg  daily   CYANOCOBALAMIN (,VITAMIN B-12,) 1000 MCG/ML INJECTION    Inject 1 mL (1,000 mcg total) into the muscle every 7 (seven) days.   DOXYCYCLINE (VIBRA-TABS) 100 MG TABLET    Take 100 mg by mouth 2 (two) times daily.   GLIMEPIRIDE (AMARYL) 1 MG TABLET    Take 1 mg by mouth every morning.   GLUCOSE BLOOD (ONETOUCH ULTRA) TEST STRIP    Test blood sugars once daily DX E11.59   HYDROCODONE-ACETAMINOPHEN (NORCO/VICODIN) 5-325 MG TABLET    Take 1 tablet by mouth every 6 (six) hours as needed for moderate pain.   LACTOBACILLUS (ACIDOPHILUS) CAPS CAPSULE    See admin instructions.   METOPROLOL SUCCINATE (TOPROL-XL) 50 MG 24 HR TABLET    Take 1 tablet (50 mg  total) by mouth daily. Take with or immediately following a meal.   NITROGLYCERIN (NITROSTAT) 0.4 MG SL TABLET    Place 0.4 mg under the tongue every 5 (five) minutes as needed for chest pain.   ONETOUCH ULTRA TEST STRIP       OXYCODONE (OXY IR/ROXICODONE) 5 MG IMMEDIATE RELEASE TABLET    Take 5 mg by mouth every 6 (six) hours as needed for severe pain.   PREGABALIN (LYRICA) 75 MG CAPSULE    Take 75 mg by mouth 2 (two) times daily.   SIMVASTATIN (ZOCOR) 20 MG TABLET    Take 20 mg by mouth daily.  Modified Medications   No medications on file  Discontinued Medications   No medications on file      Past Medical History:  Diagnosis Date   Coronary artery disease    Diabetes mellitus without complication (HCC)    Hypertension     Social History   Tobacco Use   Smoking status: Former    Pack years: 0.00   Smokeless tobacco: Former  Scientific laboratory technician Use: Never used  Substance Use Topics   Alcohol use: Not Currently   Drug use: Never    Family History  Problem Relation Age of Onset   Heart attack Mother    Emphysema Father     Allergies  Allergen Reactions   Empagliflozin Other (See Comments)    Review of Systems  Constitutional:  Positive for chills. Negative for fever.  Respiratory: Negative.    Cardiovascular:  Positive for leg swelling. Negative for chest pain.  Musculoskeletal:        + leg pain  Skin:  Positive for itching and rash.     OBJECTIVE:    Vitals:   07/09/20 1545  BP: (!) 165/70  Pulse: 62  Temp: 97.6 F (36.4 C)  TempSrc: Oral  Weight: 275 lb (124.7 kg)     Body mass index is 40.61 kg/m.  Physical Exam Constitutional:      Appearance: Normal appearance. He is obese.  Pulmonary:     Effort: Pulmonary effort is normal. No respiratory distress.  Musculoskeletal:     Right lower leg: Edema present.     Left lower leg: Edema present.  Skin:    General: Skin is warm and dry.     Findings: Erythema present.  Neurological:      General: No focal deficit present.     Mental Status: He is alert and oriented to person, place, and time.  Psychiatric:        Mood and Affect: Mood normal.        Behavior: Behavior normal.      Labs  and Microbiology:  CBC Latest Ref Rng & Units 06/02/2020 06/01/2020 05/30/2020  WBC 4.0 - 10.5 K/uL 10.3 11.1(H) 13.6(H)  Hemoglobin 13.0 - 17.0 g/dL 14.7 15.2 14.5  Hematocrit 39.0 - 52.0 % 42.9 44.4 41.9  Platelets 150 - 400 K/uL 211 232 165   CMP Latest Ref Rng & Units 06/03/2020 06/02/2020 06/01/2020  Glucose 70 - 99 mg/dL 149(H) 127(H) 148(H)  BUN 8 - 23 mg/dL 13 13 15   Creatinine 0.61 - 1.24 mg/dL 0.74 0.79 0.69  Sodium 135 - 145 mmol/L 141 140 137  Potassium 3.5 - 5.1 mmol/L 4.3 4.1 4.1  Chloride 98 - 111 mmol/L 109 108 105  CO2 22 - 32 mmol/L 23 22 20(L)  Calcium 8.9 - 10.3 mg/dL 9.1 8.9 9.2  Total Protein 6.5 - 8.1 g/dL - - 7.9  Total Bilirubin 0.3 - 1.2 mg/dL - - 1.2  Alkaline Phos 38 - 126 U/L - - 60  AST 15 - 41 U/L - - 65(H)  ALT 0 - 44 U/L - - 40     No results found for this or any previous visit (from the past 240 hour(s)).  Imaging: IMPRESSION: 1. Severe left foot cellulitis.  No abscess or osteomyelitis.   ASSESSMENT & PLAN:    Cellulitis of left lower extremity Patient with recurrent left leg cellulitis in the setting of significant ongoing lower extremity edema.  I agree with vascular surgery that this ongoing cellulitis will not resolve without addressing the vascular congestion in his legs.  I think he will have dramatic improvement with getting fitted for Unna boots and dealing with some of this fluid.  In the interim will continue him on doxycycline and cefdinir as prescribed by his primary care physician.  Will attempt to arrange for him to get a dose of IV dalbavancin in order to receive parenteral therapy given the severity of his cellulitis.  Once he receives a dose of dalbavancin, he was instructed to discontinue his oral antibiotics as this will  provide 7 to 10 days of adequate coverage.  We will have him follow-up next week with my partner, Dr. Tommy Medal, as I will be out of the clinic to ensure he has resolution of this issue and to determine if he needs further doses of dalbavancin or continuation of oral therapy.  If he continues to not have resolution of this issue despite addressing his lower extremity edema, he may benefit from formal angiography given his other comorbidities including diabetes, HFpEF, and coronary artery disease.    Raynelle [facility] for Infectious [facility] 07/10/2020, 8:57 AM   I spent 45 minutes dedicated to the care of this patient on the date of this encounter to include pre-visit review of records, face-to-face time with the patient discussing cellulitis, and post-visit ordering of testing.

## 2020-07-09 NOTE — Progress Notes (Signed)
[facility] [place]  Patient name: Jordan Drake MRN: [id number] DOB: January 05, 1941 Sex: male   REQUESTING PROVIDER:    Dr. Dagmar Hait   REASON FOR CONSULT:    Left leg swelling  HISTORY OF PRESENT ILLNESS:   Jordan Drake is a 80 y.o. male, who is referred for evaluation of recurrent left leg cellulitis.  The patient has been admitted to the hospital twice in the past 6 weeks for left leg cellulitis.  This required antibiotics.  He has never really gotten rid of the redness.  He continues to have significant swelling.  He has not worn compression socks.  He does not have any open wounds.  Patient suffers from congestive heart failure with preserved ejection fraction.  He is medically managed for hypertension.  He is a type II diabetic.  He suffers from coronary artery disease, status post CABG.  He is a former smoker.  PAST MEDICAL HISTORY    Past Medical History:  Diagnosis Date   Coronary artery disease    Diabetes mellitus without complication ([place])    Hypertension      FAMILY HISTORY   Family History  Problem Relation Age of Onset   Heart attack Mother    Emphysema Father     SOCIAL HISTORY:   Social History   Socioeconomic History   Marital status: Married    Spouse name: Kapena Hamme   Number of children: 2   Years of education: Not on file   Highest education level: Not on file  Occupational History   Occupation: retired  Tobacco Use   Smoking status: Former    Pack years: 0.00   Smokeless tobacco: Former  Scientific laboratory technician Use: Never used  Substance and Sexual Activity   Alcohol use: Not Currently   Drug use: Never   Sexual activity: Not on file  Other Topics Concern   Not on file  Social History Narrative   Not on file   Social Determinants of Health   Financial Resource Strain: Not on file  Food Insecurity: Not on file  Transportation Needs: Not on file  Physical Activity: Not on  file  Stress: Not on file  Social Connections: Not on file  Intimate Partner Violence: Not on file    ALLERGIES:    Allergies  Allergen Reactions   Empagliflozin Other (See Comments)    CURRENT MEDICATIONS:    Current Outpatient Medications  Medication Sig Dispense Refill   amLODipine-benazepril (LOTREL) 10-20 MG capsule Take 1 capsule by mouth daily.     aspirin EC 81 MG tablet Take 81 mg by mouth daily.     colchicine 0.6 MG tablet Take 1 tablet (0.6 mg total) by mouth 2 (two) times daily. 60 tablet 0   cyanocobalamin (,VITAMIN B-12,) 1000 MCG/ML injection Inject 1 mL (1,000 mcg total) into the muscle every 7 (seven) days. 10 mL 0   glimepiride (AMARYL) 1 MG tablet Take 1 mg by mouth every morning.     HYDROcodone-acetaminophen (NORCO/VICODIN) 5-325 MG tablet Take 1 tablet by mouth every 6 (six) hours as needed for moderate pain.     metoprolol succinate (TOPROL-XL) 50 MG 24 hr tablet Take 1 tablet (50 mg total) by mouth daily. Take with or immediately following a meal. 30 tablet 1   nitroGLYCERIN (NITROSTAT) 0.4 MG SL tablet Place 0.4 mg under the tongue every 5 (five) minutes as needed for chest pain.     simvastatin (ZOCOR) 20 MG tablet  Take 20 mg by mouth daily.     No current facility-administered medications for this visit.    REVIEW OF SYSTEMS:   [X]  denotes positive finding, [ ]  denotes negative finding Cardiac  Comments:  Chest pain or chest pressure:    Shortness of breath upon exertion:    Short of breath when lying flat:    Irregular heart rhythm:        Vascular    Pain in calf, thigh, or hip brought on by ambulation:    Pain in feet at night that wakes you up from your sleep:     Blood clot in your veins:    Leg swelling:  x       Pulmonary    Oxygen at home:    Productive cough:     Wheezing:         Neurologic    Sudden weakness in arms or legs:     Sudden numbness in arms or legs:     Sudden onset of difficulty speaking or slurred speech:     Temporary loss of vision in one eye:     Problems with dizziness:         Gastrointestinal    Blood in stool:      Vomited blood:         Genitourinary    Burning when urinating:     Blood in urine:        Psychiatric    Major depression:         Hematologic    Bleeding problems:    Problems with blood clotting too easily:        Skin    Rashes or ulcers:        Constitutional    Fever or chills:     PHYSICAL EXAM:   Vitals:   07/09/20 1421  BP: (!) 134/57  Pulse: (!) 56  Resp: 20  Temp: 98 F (36.7 C)  SpO2: 96%  Weight: 274 lb (124.3 kg)  Height: 5\' 9"  (1.753 m)    GENERAL: The patient is a well-nourished male, in no acute distress. The vital signs are documented above. CARDIAC: There is a regular rate and rhythm.  VASCULAR: Significant pitting edema bilaterally. PULMONARY: Nonlabored respirations MUSCULOSKELETAL: There are no major deformities or cyanosis. NEUROLOGIC: No focal weakness or paresthesias are detected. SKIN: His skin blanches with compression suggesting PSYCHIATRIC: The patient has a normal affect.   STUDIES:   I have reviewed the following studies: ABI/TBIToday's ABIToday's TBIPrevious ABIPrevious TBI  +-------+-----------+-----------+------------+------------+  Right  [place]         0.81                                 +-------+-----------+-----------+------------+------------+  Left   [place]         0.45                                 +-------+-----------+-----------+------------+------------+   Left great toe: 72 Right great toe: 128   Venous ultrasound negative for DVT  ASSESSMENT and PLAN   Recurrent left leg cellulitis: I discussed with the patient that I believe he still has ongoing cellulitis.  I do not think he will be able to get rid of this until we can get the fluid out of his leg.  Currently, I think his leg  is too swollen to fit him for compression socks.  Therefore, I am going to place him in a Unna boot  which I would like changed 3 times a week by home health.  He will then return to the office next week for a repeat evaluation.  Hopefully at that time we will be able to get an accurate measurement of his legs in order to place him in compression socks.  He had ABIs today which could not be calculated because his vessels were noncompressible.  Although the waveforms were biphasic.  I suspect that he is got adequate blood flow to heal the cellulitis.  However, if he continues to have issues with this he may need to undergo formal angiography.   Leia Alf, MD, FACS [facility] [phone] Pager [phone]

## 2020-07-09 NOTE — Telephone Encounter (Signed)
I called short stay and left a message for them to call back to get the patient scheduled for a IV infusion of dalbavancin x 1. Patient informed that we will call him back with his appointment information.   T Brooks Sailors

## 2020-07-09 NOTE — Patient Instructions (Signed)
Thank you for coming to see me today. It was a pleasure seeing you.  To Do: Continue your oral antibiotics (doxycycline and cefdinir) until you get the IV infusion of Dalbavancin then you can stop your antibiotics Arrange for Unna boots to be done as directed by Dr Stephens Shire office Please follow up with [place] in 7-10 days for a recheck of your cellulitis after getting the IV infusion  If you have any questions or concerns, please do not hesitate to call the office at [phone].  Take Care,   Jule Ser

## 2020-07-09 NOTE — Telephone Encounter (Signed)
Order has been faxed to short stay.

## 2020-07-10 ENCOUNTER — Encounter: Payer: Self-pay | Admitting: Internal Medicine

## 2020-07-10 ENCOUNTER — Ambulatory Visit (INDEPENDENT_AMBULATORY_CARE_PROVIDER_SITE_OTHER): Payer: PPO

## 2020-07-10 DIAGNOSIS — E538 Deficiency of other specified B group vitamins: Secondary | ICD-10-CM | POA: Insufficient documentation

## 2020-07-10 DIAGNOSIS — M7989 Other specified soft tissue disorders: Secondary | ICD-10-CM | POA: Diagnosis not present

## 2020-07-10 DIAGNOSIS — M79672 Pain in left foot: Secondary | ICD-10-CM | POA: Insufficient documentation

## 2020-07-10 DIAGNOSIS — B372 Candidiasis of skin and nail: Secondary | ICD-10-CM | POA: Insufficient documentation

## 2020-07-10 DIAGNOSIS — I739 Peripheral vascular disease, unspecified: Secondary | ICD-10-CM | POA: Insufficient documentation

## 2020-07-10 NOTE — Telephone Encounter (Signed)
Patient scheduled for short stay 07/17/20 at 9 am, first available appointment. Notified patient's wife of appointment time and location, she is agreeable. Re-faxed orders to short stay.   Beryle Flock, RN

## 2020-07-10 NOTE — Assessment & Plan Note (Signed)
Patient with recurrent left leg cellulitis in the setting of significant ongoing lower extremity edema.  I agree with vascular surgery that this ongoing cellulitis will not resolve without addressing the vascular congestion in his legs.  I think he will have dramatic improvement with getting fitted for Unna boots and dealing with some of this fluid.  In the interim will continue him on doxycycline and cefdinir as prescribed by his primary care physician.  Will attempt to arrange for him to get a dose of IV dalbavancin in order to receive parenteral therapy given the severity of his cellulitis.  Once he receives a dose of dalbavancin, he was instructed to discontinue his oral antibiotics as this will provide 7 to 10 days of adequate coverage.  We will have him follow-up next week with my partner, Dr. Tommy Medal, as I will be out of the clinic to ensure he has resolution of this issue and to determine if he needs further doses of dalbavancin or continuation of oral therapy.  If he continues to not have resolution of this issue despite addressing his lower extremity edema, he may benefit from formal angiography given his other comorbidities including diabetes, HFpEF, and coronary artery disease.

## 2020-07-10 NOTE — Progress Notes (Signed)
Jordan Drake came in today for an [name] per Dr Stephens Shire instructions yesterday, 07/09/2020 to treat swelling.  No wounds present.  [employer] to left leg.Gave instructions to patient for elevation of the left leg above the level of his heart.  Patient voiced understanding of the instructions.  Patient has a return appointment Friday, July 13, 2020.

## 2020-07-13 ENCOUNTER — Encounter: Payer: Self-pay | Admitting: Physician Assistant

## 2020-07-13 ENCOUNTER — Ambulatory Visit: Payer: PPO | Admitting: Physician Assistant

## 2020-07-13 ENCOUNTER — Other Ambulatory Visit: Payer: Self-pay

## 2020-07-13 ENCOUNTER — Other Ambulatory Visit (HOSPITAL_COMMUNITY): Payer: Self-pay | Admitting: *Deleted

## 2020-07-13 VITALS — BP 142/67 | HR 52 | Temp 98.2°F | Resp 20 | Ht 69.0 in

## 2020-07-13 DIAGNOSIS — L03116 Cellulitis of left lower limb: Secondary | ICD-10-CM | POA: Diagnosis not present

## 2020-07-13 DIAGNOSIS — M7989 Other specified soft tissue disorders: Secondary | ICD-10-CM

## 2020-07-13 NOTE — Progress Notes (Signed)
POST OPERATIVE OFFICE NOTE    CC:  F/u for surgery  HPI:  This is a 80 y.o. male who is here for follow up on his left LE edema with recurrent cellulitis.    Pt returns today for follow up.  Pt states his left leg has decreased edema and significantly improved erythema.  He denise pain, fever and chills.  He is being followed by ID and has been receiving antibiotics.    Patient suffers from congestive heart failure with preserved ejection fraction.  He is medically managed for hypertension.  He is a type II diabetic.  He suffers from coronary artery disease, status post CABG.  He is a former smoker.  Allergies  Allergen Reactions   Empagliflozin Other (See Comments)    Current Outpatient Medications  Medication Sig Dispense Refill   allopurinol (ZYLOPRIM) 300 MG tablet Take 300 mg by mouth daily.     amLODipine-benazepril (LOTREL) 10-20 MG capsule Take 1 capsule by mouth daily.     Ascorbic Acid (VITAMIN C) 1000 MG tablet Take 1 tablet by mouth daily.     aspirin EC 81 MG tablet Take 81 mg by mouth daily.     cefdinir (OMNICEF) 300 MG capsule Take 300 mg by mouth 2 (two) times daily.     Cholecalciferol (VITAMIN D3) 125 MCG (5000 UT) CAPS See admin instructions.     ciclopirox (LOPROX) 0.77 % cream APPLY TO AFFECTED AREA TWICE A DAY     colchicine 0.6 MG tablet Take 1 tablet (0.6 mg total) by mouth 2 (two) times daily. 60 tablet 0   Cranberry 250 MG CAPS 4200mg  daily     cyanocobalamin (,VITAMIN B-12,) 1000 MCG/ML injection Inject 1 mL (1,000 mcg total) into the muscle every 7 (seven) days. 10 mL 0   doxycycline (VIBRA-TABS) 100 MG tablet Take 100 mg by mouth 2 (two) times daily.     glimepiride (AMARYL) 1 MG tablet Take 1 mg by mouth every morning.     glucose blood (ONETOUCH ULTRA) test strip Test blood sugars once daily DX E11.59     HYDROcodone-acetaminophen (NORCO/VICODIN) 5-325 MG tablet Take 1 tablet by mouth every 6 (six) hours as needed for moderate pain.     Lactobacillus  (ACIDOPHILUS) CAPS capsule See admin instructions.     Lancets (ONETOUCH DELICA PLUS [id number]) MISC Apply 1 each topically daily.     metoprolol succinate (TOPROL-XL) 50 MG 24 hr tablet Take 1 tablet (50 mg total) by mouth daily. Take with or immediately following a meal. 30 tablet 1   nitroGLYCERIN (NITROSTAT) 0.4 MG SL tablet Place 0.4 mg under the tongue every 5 (five) minutes as needed for chest pain.     nystatin (MYCOSTATIN) 100000 UNIT/ML suspension SMARTSIG:4 Milliliter(s) By Mouth 4 Times Daily     ONETOUCH ULTRA test strip      oxyCODONE (OXY IR/ROXICODONE) 5 MG immediate release tablet Take 5 mg by mouth every 6 (six) hours as needed for severe pain.     predniSONE (DELTASONE) 20 MG tablet Take 40 mg by mouth daily.     pregabalin (LYRICA) 75 MG capsule Take 75 mg by mouth 2 (two) times daily.     simvastatin (ZOCOR) 20 MG tablet Take 20 mg by mouth daily.     triamcinolone cream (KENALOG) 0.1 % SMARTSIG:1 Application Topical 2-3 Times Daily     No current facility-administered medications for this visit.     ROS:  See HPI  Physical Exam:  He has a palpable DP pulse, motor and sensation are intact.   Lungs non labored breathing    Assessment/Plan:  This is a 80 y.o. male who is s/p an episode of Recurrent left leg cellulitis. He was measured today for knee high compression 20-30 mm HG.  He will wear these daily, elevate his legs and exercise as tolerates.  F/U PRN  He does have calcified vessels with decreased arterial flow.  We discussed signs and symptoms of rest pain, non healing wounds or claudication which he currently does not have.        Roxy Horseman PA-C [facility] [phone]   Clinic MD:  Donzetta Matters

## 2020-07-17 ENCOUNTER — Ambulatory Visit (HOSPITAL_COMMUNITY)
Admission: RE | Admit: 2020-07-17 | Discharge: 2020-07-17 | Disposition: A | Discharge status: Home / Self Care | Payer: PPO | Source: Ambulatory Visit | Attending: Internal Medicine | Admitting: Internal Medicine

## 2020-07-17 ENCOUNTER — Other Ambulatory Visit: Payer: Self-pay

## 2020-07-17 DIAGNOSIS — L03116 Cellulitis of left lower limb: Secondary | ICD-10-CM | POA: Diagnosis not present

## 2020-07-17 MED ORDER — DEXTROSE 5 % IV SOLN
1500.0000 mg | Freq: Once | INTRAVENOUS | Status: AC
  Administered 2020-07-17: 1500 mg via INTRAVENOUS
  Filled 2020-07-17: qty 75

## 2020-07-19 ENCOUNTER — Ambulatory Visit: Payer: PPO | Admitting: Infectious Disease

## 2020-07-19 ENCOUNTER — Encounter: Payer: Self-pay | Admitting: Infectious Disease

## 2020-07-19 ENCOUNTER — Other Ambulatory Visit: Payer: Self-pay

## 2020-07-19 VITALS — BP 148/65 | HR 52 | Temp 98.0°F

## 2020-07-19 DIAGNOSIS — L03116 Cellulitis of left lower limb: Secondary | ICD-10-CM | POA: Diagnosis not present

## 2020-07-19 DIAGNOSIS — I25810 Atherosclerosis of coronary artery bypass graft(s) without angina pectoris: Secondary | ICD-10-CM | POA: Diagnosis not present

## 2020-07-19 DIAGNOSIS — I739 Peripheral vascular disease, unspecified: Secondary | ICD-10-CM | POA: Diagnosis not present

## 2020-07-19 MED ORDER — CEFADROXIL 500 MG PO CAPS: 1000.0000 mg | ORAL_CAPSULE | Freq: Two times a day (BID) | ORAL | 4 refills | Status: DC

## 2020-07-19 NOTE — Patient Instructions (Signed)
Followup with Dr. Juleen China in one month
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
## 2020-07-20 ENCOUNTER — Telehealth: Payer: Self-pay

## 2020-07-20 DIAGNOSIS — L03116 Cellulitis of left lower limb: Secondary | ICD-10-CM

## 2020-07-20 MED ORDER — CEFADROXIL 500 MG PO CAPS: 1000.0000 mg | ORAL_CAPSULE | Freq: Two times a day (BID) | ORAL | 4 refills | Status: AC

## 2020-07-20 NOTE — Telephone Encounter (Signed)
Pharmacy called wanting to clarify cefodroxil prescription. Per instructions patient is to take a total of 4 capsules a day and dispense quantity was 10 with 4 refills. Will route to provider.   [facility]: [place], RN

## 2020-07-20 NOTE — Addendum Note (Signed)
Addended by: Lucie Leather D on: 07/20/2020 04:47 PM   Modules accepted: Orders

## 2020-08-02 DIAGNOSIS — E538 Deficiency of other specified B group vitamins: Secondary | ICD-10-CM | POA: Diagnosis not present

## 2020-08-02 DIAGNOSIS — E781 Pure hyperglyceridemia: Secondary | ICD-10-CM | POA: Diagnosis not present

## 2020-08-02 DIAGNOSIS — Z125 Encounter for screening for malignant neoplasm of prostate: Secondary | ICD-10-CM | POA: Diagnosis not present

## 2020-08-02 DIAGNOSIS — E1151 Type 2 diabetes mellitus with diabetic peripheral angiopathy without gangrene: Secondary | ICD-10-CM | POA: Diagnosis not present

## 2020-08-09 DIAGNOSIS — F39 Unspecified mood [affective] disorder: Secondary | ICD-10-CM | POA: Diagnosis not present

## 2020-08-09 DIAGNOSIS — R233 Spontaneous ecchymoses: Secondary | ICD-10-CM | POA: Diagnosis not present

## 2020-08-09 DIAGNOSIS — L03116 Cellulitis of left lower limb: Secondary | ICD-10-CM | POA: Diagnosis not present

## 2020-08-09 DIAGNOSIS — M1990 Unspecified osteoarthritis, unspecified site: Secondary | ICD-10-CM | POA: Diagnosis not present

## 2020-08-09 DIAGNOSIS — I25119 Atherosclerotic heart disease of native coronary artery with unspecified angina pectoris: Secondary | ICD-10-CM | POA: Diagnosis not present

## 2020-08-09 DIAGNOSIS — F1120 Opioid dependence, uncomplicated: Secondary | ICD-10-CM | POA: Diagnosis not present

## 2020-08-09 DIAGNOSIS — R0989 Other specified symptoms and signs involving the circulatory and respiratory systems: Secondary | ICD-10-CM | POA: Diagnosis not present

## 2020-08-09 DIAGNOSIS — E1151 Type 2 diabetes mellitus with diabetic peripheral angiopathy without gangrene: Secondary | ICD-10-CM | POA: Diagnosis not present

## 2020-08-09 DIAGNOSIS — Z0000 Encounter for general adult medical examination without abnormal findings: Secondary | ICD-10-CM | POA: Diagnosis not present

## 2020-08-09 DIAGNOSIS — R82998 Other abnormal findings in urine: Secondary | ICD-10-CM | POA: Diagnosis not present

## 2020-08-09 DIAGNOSIS — E781 Pure hyperglyceridemia: Secondary | ICD-10-CM | POA: Diagnosis not present

## 2020-08-09 DIAGNOSIS — I10 Essential (primary) hypertension: Secondary | ICD-10-CM | POA: Diagnosis not present

## 2020-08-09 DIAGNOSIS — I739 Peripheral vascular disease, unspecified: Secondary | ICD-10-CM | POA: Diagnosis not present

## 2020-08-21 ENCOUNTER — Ambulatory Visit: Payer: PPO | Admitting: Internal Medicine

## 2020-08-21 ENCOUNTER — Encounter: Payer: Self-pay | Admitting: Internal Medicine

## 2020-08-21 ENCOUNTER — Other Ambulatory Visit: Payer: Self-pay

## 2020-08-21 DIAGNOSIS — L03116 Cellulitis of left lower limb: Secondary | ICD-10-CM

## 2020-08-21 DIAGNOSIS — M7989 Other specified soft tissue disorders: Secondary | ICD-10-CM

## 2020-08-21 NOTE — Assessment & Plan Note (Signed)
This has improved greatly with compression therapy and foot elevation.

## 2020-08-21 NOTE — Assessment & Plan Note (Signed)
Patient without evidence of ongoing cellulitis after dalbavancin infusion last month.  He continues to work on decreasing his lower extremity edema which I think is most beneficial to decreasing the erythema in his leg.  The petechial rash previously noted has resolved.  He was advised to keep the cefadroxil as needed on hand in case of recurrence and let our office know if he has any recurrence.

## 2020-08-21 NOTE — Progress Notes (Signed)
[facility]  CHIEF COMPLAINT:    Follow up for cellulitis  SUBJECTIVE:    Jordan Drake is a 80 y.o. male with PMHx as below who presents to the clinic for cellulitis.   Patient previously saw me on 6/27 for refractory cellulitis of the lower extremities in the setting of peripheral edema.  He was continued on cefdinir/doxy at that time and arranged for dalbavancin infusion on 7/5.  Followed up then with Dr Tommy Medal on 7/7 and was improved.  No further cellulitis noted.  He was given a prescription for cefadroxil '500mg'$  BID to take as needed at the first signs of new infection with instructions to call our clinic.  He saw his PCP on 7/28 and noted to have a petechial rash of unclear etiology on legs but feeling wsa this was not cellulitis and labs were stable.  He was advised to hold off resuming antibiotics.   Please see A&P for the details of today's visit and status of the patient's medical problems.   Patient's Medications  New Prescriptions   No medications on file  Previous Medications   ALLOPURINOL (ZYLOPRIM) 300 MG TABLET    Take 300 mg by mouth daily.   AMLODIPINE-BENAZEPRIL (LOTREL) 10-20 MG CAPSULE    Take 1 capsule by mouth daily.   ASCORBIC ACID (VITAMIN C) 1000 MG TABLET    Take 1 tablet by mouth daily.   ASPIRIN EC 81 MG TABLET    Take 81 mg by mouth daily.   CEFADROXIL (DURICEF) 500 MG CAPSULE    Take 2 capsules (1,000 mg total) by mouth 2 (two) times daily. To take if cellulitis is returning, but please also let us now with Infectious Disease   CHOLECALCIFEROL (VITAMIN D3) 125 MCG (5000 UT) CAPS    See admin instructions.   CICLOPIROX (LOPROX) 0.77 % CREAM    APPLY TO AFFECTED AREA TWICE A DAY   COLCHICINE 0.6 MG TABLET    Take 1 tablet (0.6 mg total) by mouth 2 (two) times daily.   CRANBERRY 250 MG CAPS    '4200mg'$  daily   CYANOCOBALAMIN (,VITAMIN B-12,) 1000 MCG/ML INJECTION    Inject 1 mL (1,000 mcg total) into the muscle every 7  (seven) days.   GLIMEPIRIDE (AMARYL) 1 MG TABLET    Take 1 mg by mouth every morning.   GLUCOSE BLOOD (ONETOUCH ULTRA) TEST STRIP    Test blood sugars once daily DX E11.59   HYDROCODONE-ACETAMINOPHEN (NORCO/VICODIN) 5-325 MG TABLET    One p.o. q.6 hours p.r.n. M19.90   LACTOBACILLUS (ACIDOPHILUS) CAPS CAPSULE    See admin instructions.   LANCETS (ONETOUCH DELICA PLUS 123XX123) MISC    Apply 1 each topically daily.   METOPROLOL SUCCINATE (TOPROL-XL) 50 MG 24 HR TABLET    Take 1 tablet (50 mg total) by mouth daily. Take with or immediately following a meal.   NITROGLYCERIN (NITROSTAT) 0.4 MG SL TABLET    Place 0.4 mg under the tongue every 5 (five) minutes as needed for chest pain.   NYSTATIN (MYCOSTATIN) 100000 UNIT/ML SUSPENSION       ONETOUCH ULTRA TEST STRIP       PREDNISONE (DELTASONE) 20 MG TABLET    Take 40 mg by mouth daily.   PREGABALIN (LYRICA) 75 MG CAPSULE    Take 75 mg by mouth 2 (two) times daily.   SIMVASTATIN (ZOCOR) 20 MG TABLET    Take 20 mg by mouth daily.  TRIAMCINOLONE CREAM (KENALOG) 0.1 %    SMARTSIG:1 Application Topical 2-3 Times Daily  Modified Medications   No medications on file  Discontinued Medications   No medications on file      Past Medical History:  Diagnosis Date   Coronary artery disease    Diabetes mellitus without complication ([facility])    Hypertension     Social History   Tobacco Use   Smoking status: Former   Smokeless tobacco: Former  Scientific laboratory technician Use: Never used  Substance Use Topics   Alcohol use: Not Currently   Drug use: Never    Family History  Problem Relation Age of Onset   Heart attack Mother    Emphysema Father     Allergies  Allergen Reactions   Empagliflozin Other (See Comments)    Review of Systems  Constitutional: Negative.   Respiratory: Negative.    Cardiovascular:  Positive for leg swelling.  Skin: Negative.     OBJECTIVE:    Vitals:   08/21/20 1106  BP: (!) 160/68  Pulse: (!) 51  Temp: (!)  97.5 F (36.4 C)  TempSrc: Oral  SpO2: 97%   There is no height or weight on file to calculate BMI.  Physical Exam Constitutional:      General: He is not in acute distress.    Appearance: Normal appearance.  HENT:     Head: Normocephalic and atraumatic.  Pulmonary:     Effort: Pulmonary effort is normal. No respiratory distress.  Skin:    General: Skin is warm and dry.     Findings: No erythema or rash.     Comments: Left leg is pink and warm, no rash noted.  Mild LE edema noted.  Band aid on left great toe.   Neurological:     General: No focal deficit present.     Mental Status: He is alert and oriented to person, place, and time.  Psychiatric:        Mood and Affect: Mood normal.        Behavior: Behavior normal.     Labs and Microbiology: CBC Latest Ref Rng & Units 06/02/2020 06/01/2020 05/30/2020  WBC 4.0 - 10.5 K/uL 10.3 11.1(H) 13.6(H)  Hemoglobin 13.0 - 17.0 g/dL 14.7 15.2 14.5  Hematocrit 39.0 - 52.0 % 42.9 44.4 41.9  Platelets 150 - 400 K/uL 211 232 165   CMP Latest Ref Rng & Units 06/03/2020 06/02/2020 06/01/2020  Glucose 70 - 99 mg/dL 149(H) 127(H) 148(H)  BUN 8 - 23 mg/dL '13 13 15  '$ Creatinine 0.61 - 1.24 mg/dL 0.74 0.79 0.69  Sodium 135 - 145 mmol/L 141 140 137  Potassium 3.5 - 5.1 mmol/L 4.3 4.1 4.1  Chloride 98 - 111 mmol/L 109 108 105  CO2 22 - 32 mmol/L 23 22 20(L)  Calcium 8.9 - 10.3 mg/dL 9.1 8.9 9.2  Total Protein 6.5 - 8.1 g/dL - - 7.9  Total Bilirubin 0.3 - 1.2 mg/dL - - 1.2  Alkaline Phos 38 - 126 U/L - - 60  AST 15 - 41 U/L - - 65(H)  ALT 0 - 44 U/L - - 40        ASSESSMENT & PLAN:    Cellulitis of left lower extremity Patient without evidence of ongoing cellulitis after dalbavancin infusion last month.  He continues to work on decreasing his lower extremity edema which I think is most beneficial to decreasing the erythema in his leg.  The petechial rash previously noted has resolved.  He was advised to keep the cefadroxil as needed on hand  in case of recurrence and let our office know if he has any recurrence.    Swelling of left foot This has improved greatly with compression therapy and foot elevation.         [facility] for Infectious [facility] 08/21/2020, 11:34 AM

## 2021-04-12 DIAGNOSIS — M1990 Unspecified osteoarthritis, unspecified site: Secondary | ICD-10-CM | POA: Diagnosis not present

## 2021-04-12 DIAGNOSIS — E1151 Type 2 diabetes mellitus with diabetic peripheral angiopathy without gangrene: Secondary | ICD-10-CM | POA: Diagnosis not present

## 2021-04-12 DIAGNOSIS — I10 Essential (primary) hypertension: Secondary | ICD-10-CM | POA: Diagnosis not present

## 2021-04-12 DIAGNOSIS — E781 Pure hyperglyceridemia: Secondary | ICD-10-CM | POA: Diagnosis not present

## 2021-06-28 DIAGNOSIS — Z961 Presence of intraocular lens: Secondary | ICD-10-CM | POA: Diagnosis not present

## 2021-06-28 DIAGNOSIS — E119 Type 2 diabetes mellitus without complications: Secondary | ICD-10-CM | POA: Diagnosis not present

## 2021-06-28 DIAGNOSIS — H26491 Other secondary cataract, right eye: Secondary | ICD-10-CM | POA: Diagnosis not present

## 2021-08-19 DIAGNOSIS — I10 Essential (primary) hypertension: Secondary | ICD-10-CM | POA: Diagnosis not present

## 2021-08-19 DIAGNOSIS — R7989 Other specified abnormal findings of blood chemistry: Secondary | ICD-10-CM | POA: Diagnosis not present

## 2021-08-19 DIAGNOSIS — E538 Deficiency of other specified B group vitamins: Secondary | ICD-10-CM | POA: Diagnosis not present

## 2021-08-19 DIAGNOSIS — Z125 Encounter for screening for malignant neoplasm of prostate: Secondary | ICD-10-CM | POA: Diagnosis not present

## 2021-08-19 DIAGNOSIS — R739 Hyperglycemia, unspecified: Secondary | ICD-10-CM | POA: Diagnosis not present

## 2021-08-19 DIAGNOSIS — E781 Pure hyperglyceridemia: Secondary | ICD-10-CM | POA: Diagnosis not present

## 2021-08-22 DIAGNOSIS — R82998 Other abnormal findings in urine: Secondary | ICD-10-CM | POA: Diagnosis not present

## 2021-08-26 DIAGNOSIS — E781 Pure hyperglyceridemia: Secondary | ICD-10-CM | POA: Diagnosis not present

## 2021-08-26 DIAGNOSIS — I872 Venous insufficiency (chronic) (peripheral): Secondary | ICD-10-CM | POA: Diagnosis not present

## 2021-08-26 DIAGNOSIS — I739 Peripheral vascular disease, unspecified: Secondary | ICD-10-CM | POA: Diagnosis not present

## 2021-08-26 DIAGNOSIS — G459 Transient cerebral ischemic attack, unspecified: Secondary | ICD-10-CM | POA: Diagnosis not present

## 2021-08-26 DIAGNOSIS — Z6841 Body Mass Index (BMI) 40.0 and over, adult: Secondary | ICD-10-CM | POA: Diagnosis not present

## 2021-08-26 DIAGNOSIS — R0989 Other specified symptoms and signs involving the circulatory and respiratory systems: Secondary | ICD-10-CM | POA: Diagnosis not present

## 2021-08-26 DIAGNOSIS — I10 Essential (primary) hypertension: Secondary | ICD-10-CM | POA: Diagnosis not present

## 2021-08-26 DIAGNOSIS — E1151 Type 2 diabetes mellitus with diabetic peripheral angiopathy without gangrene: Secondary | ICD-10-CM | POA: Diagnosis not present

## 2021-08-26 DIAGNOSIS — Z0000 Encounter for general adult medical examination without abnormal findings: Secondary | ICD-10-CM | POA: Diagnosis not present

## 2021-08-26 DIAGNOSIS — M1990 Unspecified osteoarthritis, unspecified site: Secondary | ICD-10-CM | POA: Diagnosis not present

## 2021-08-26 DIAGNOSIS — I25119 Atherosclerotic heart disease of native coronary artery with unspecified angina pectoris: Secondary | ICD-10-CM | POA: Diagnosis not present

## 2021-10-28 DIAGNOSIS — I10 Essential (primary) hypertension: Secondary | ICD-10-CM | POA: Diagnosis not present

## 2021-10-28 DIAGNOSIS — E1129 Type 2 diabetes mellitus with other diabetic kidney complication: Secondary | ICD-10-CM | POA: Diagnosis not present

## 2021-10-28 DIAGNOSIS — I25119 Atherosclerotic heart disease of native coronary artery with unspecified angina pectoris: Secondary | ICD-10-CM | POA: Diagnosis not present

## 2021-10-28 DIAGNOSIS — G459 Transient cerebral ischemic attack, unspecified: Secondary | ICD-10-CM | POA: Diagnosis not present

## 2021-10-28 DIAGNOSIS — B372 Candidiasis of skin and nail: Secondary | ICD-10-CM | POA: Diagnosis not present

## 2021-10-28 DIAGNOSIS — I872 Venous insufficiency (chronic) (peripheral): Secondary | ICD-10-CM | POA: Diagnosis not present

## 2022-04-10 IMAGING — DX DG FOOT 2V*L*
2 series · 2 of 2 positions shown · non-contrast
Comparison: None.

CLINICAL DATA: Status post fall.

EXAM:
LEFT FOOT - 2 VIEW

[foot ap]
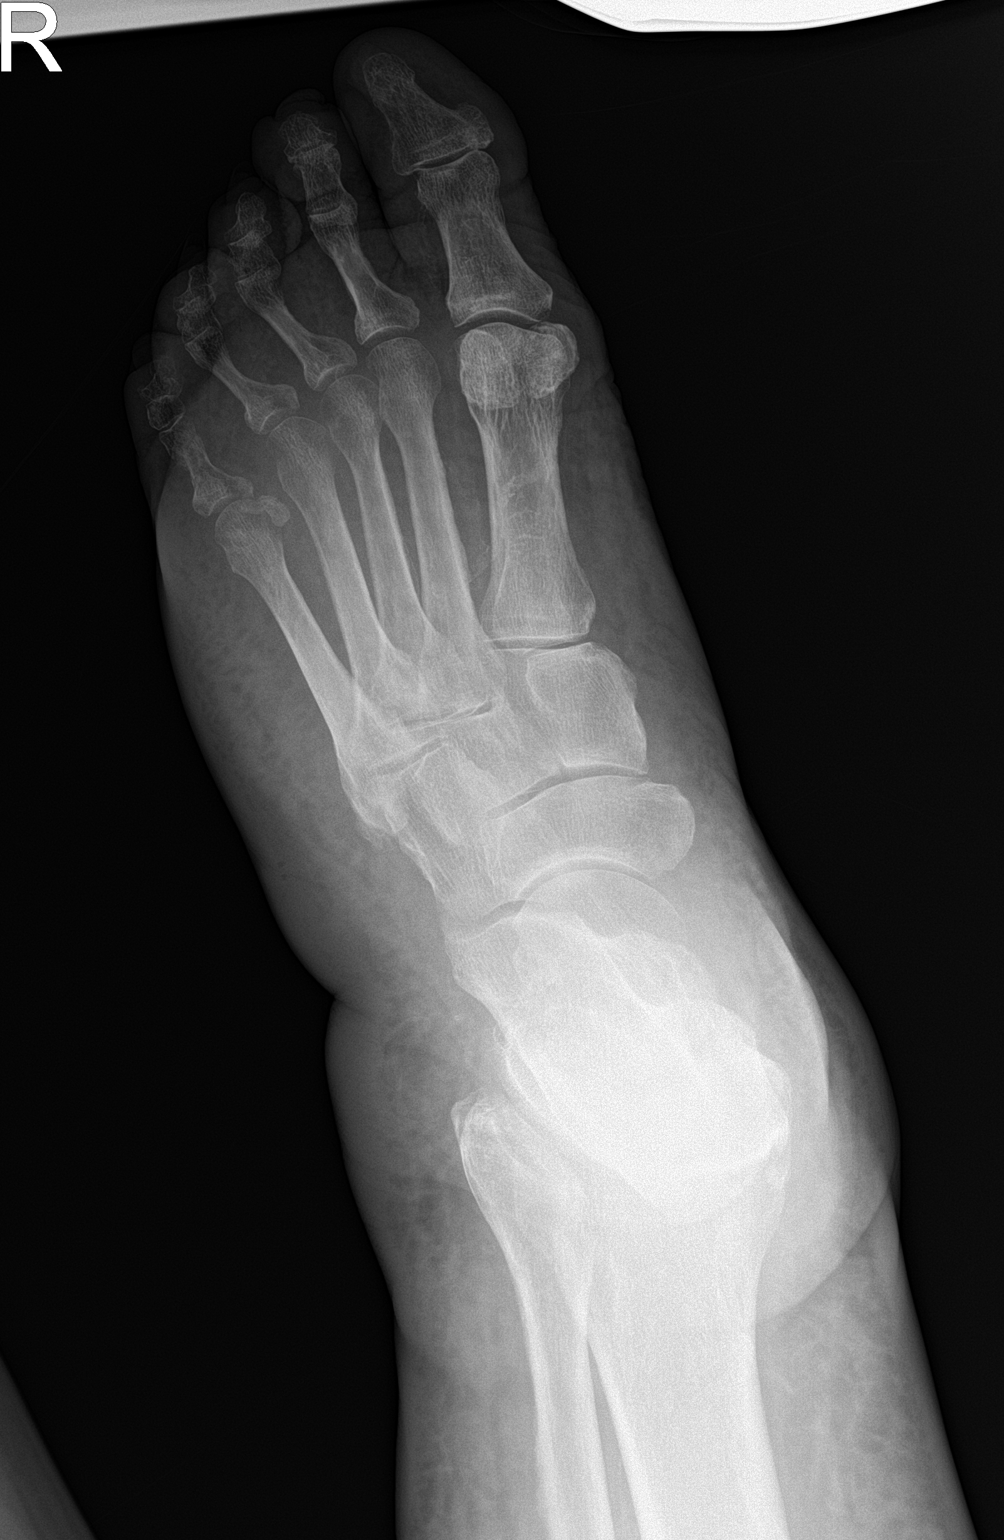

[foot lat]
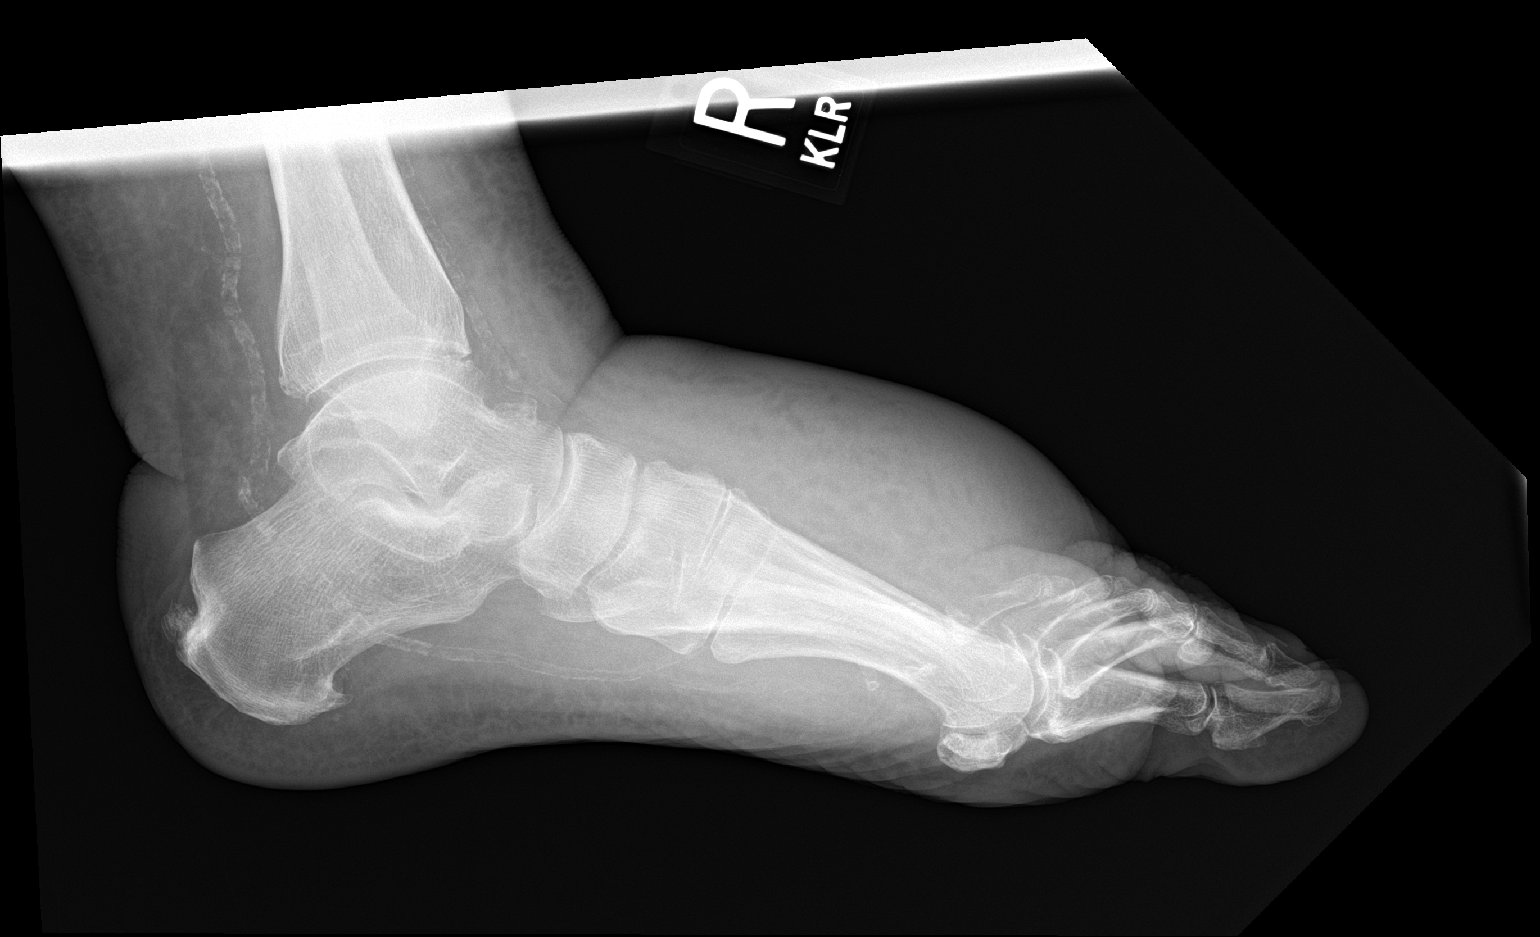

[2 of 2 positions shown; findings below may reference images not displayed]

FINDINGS: A fracture deformity of indeterminate age is seen involving the
proximal phalanx of the fifth right toe. There is no evidence of
dislocation. Mild chronic changes are seen along the anterior aspect
of the distal right tibia. Marked severity diffuse soft tissue
swelling is seen with diffuse distortion of the subcutaneous fat
throughout the right foot and right ankle.
IMPRESSION: 1. Fracture of the proximal phalanx of the fifth right toe of
indeterminate age.
2. Additional findings consistent with diffuse cellulitis.
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
## 2022-04-14 IMAGING — CR DG FOOT COMPLETE 3+V*L*
3 series · 3 of 3 positions shown · non-contrast
Comparison: None.

CLINICAL DATA: Worsening left lower extremity cellulitis.

EXAM:
LEFT FOOT - COMPLETE 3+ VIEW

[foot ap]
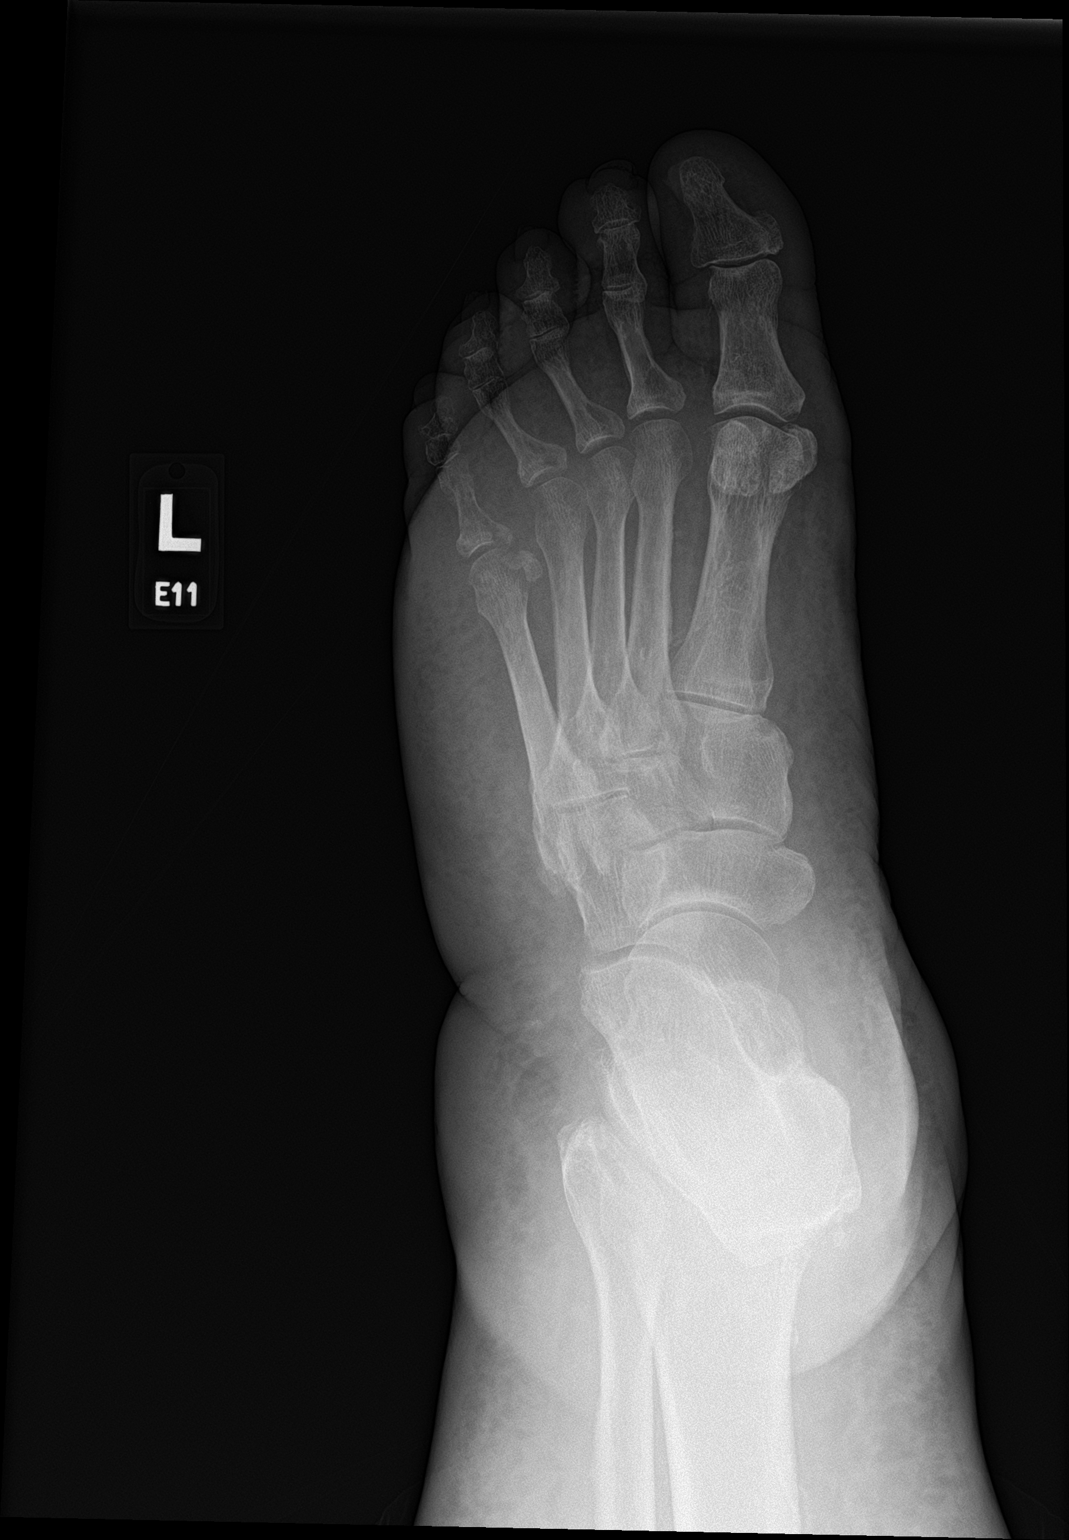

[foot obl]
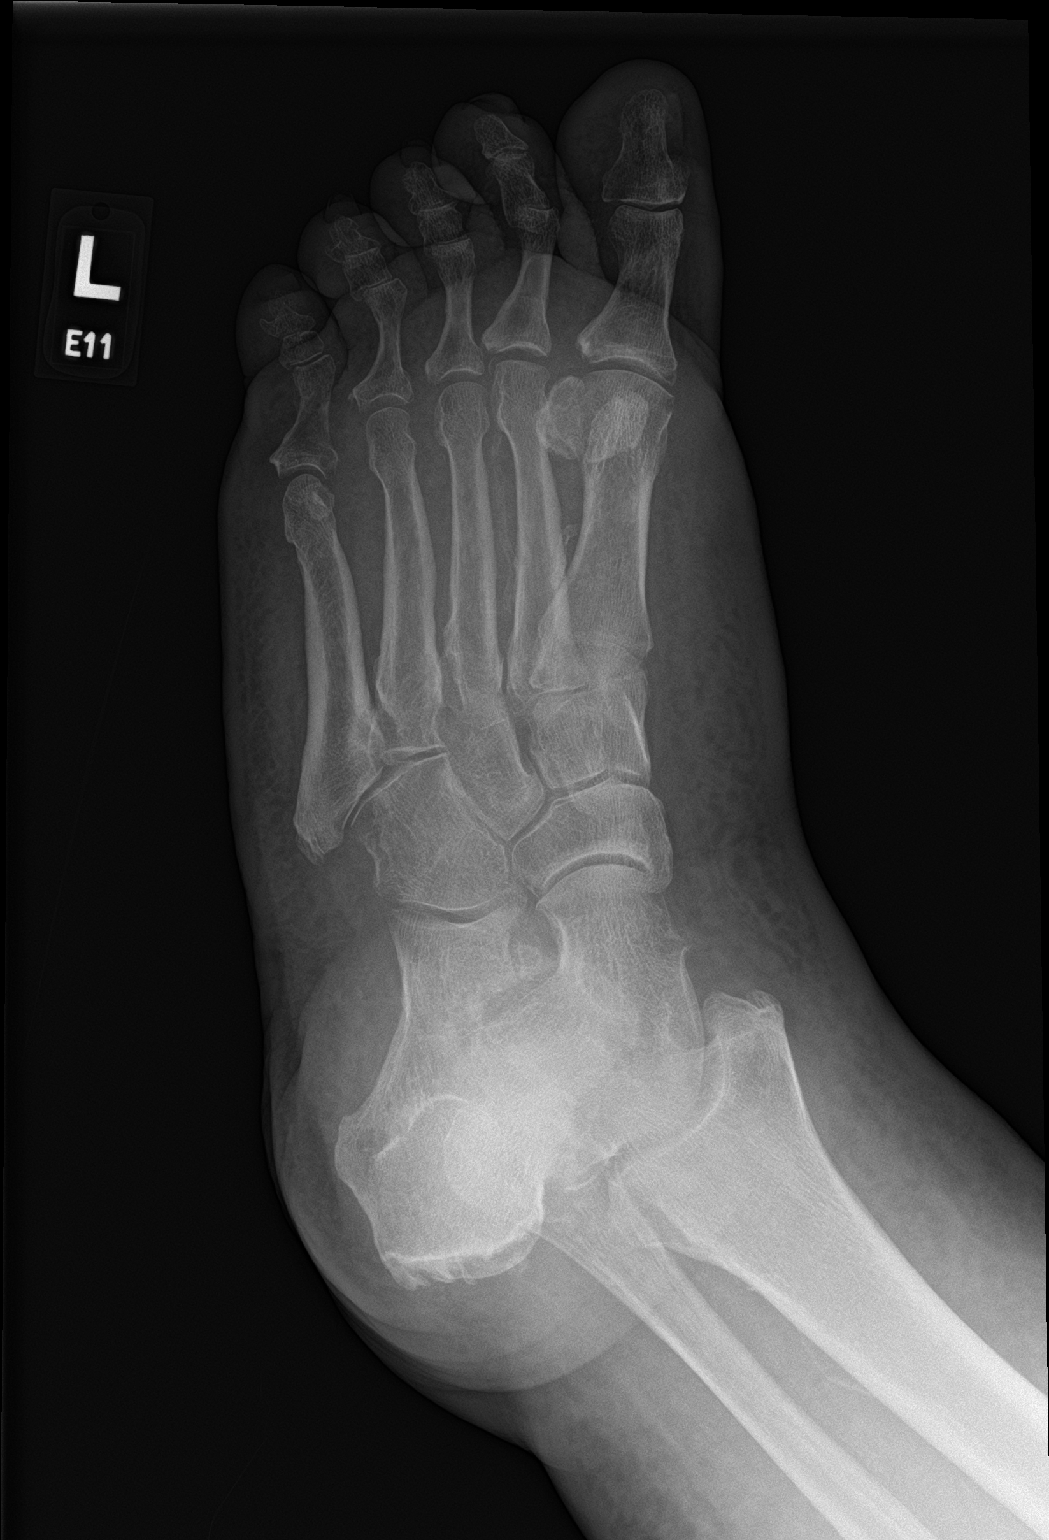

[foot lat]
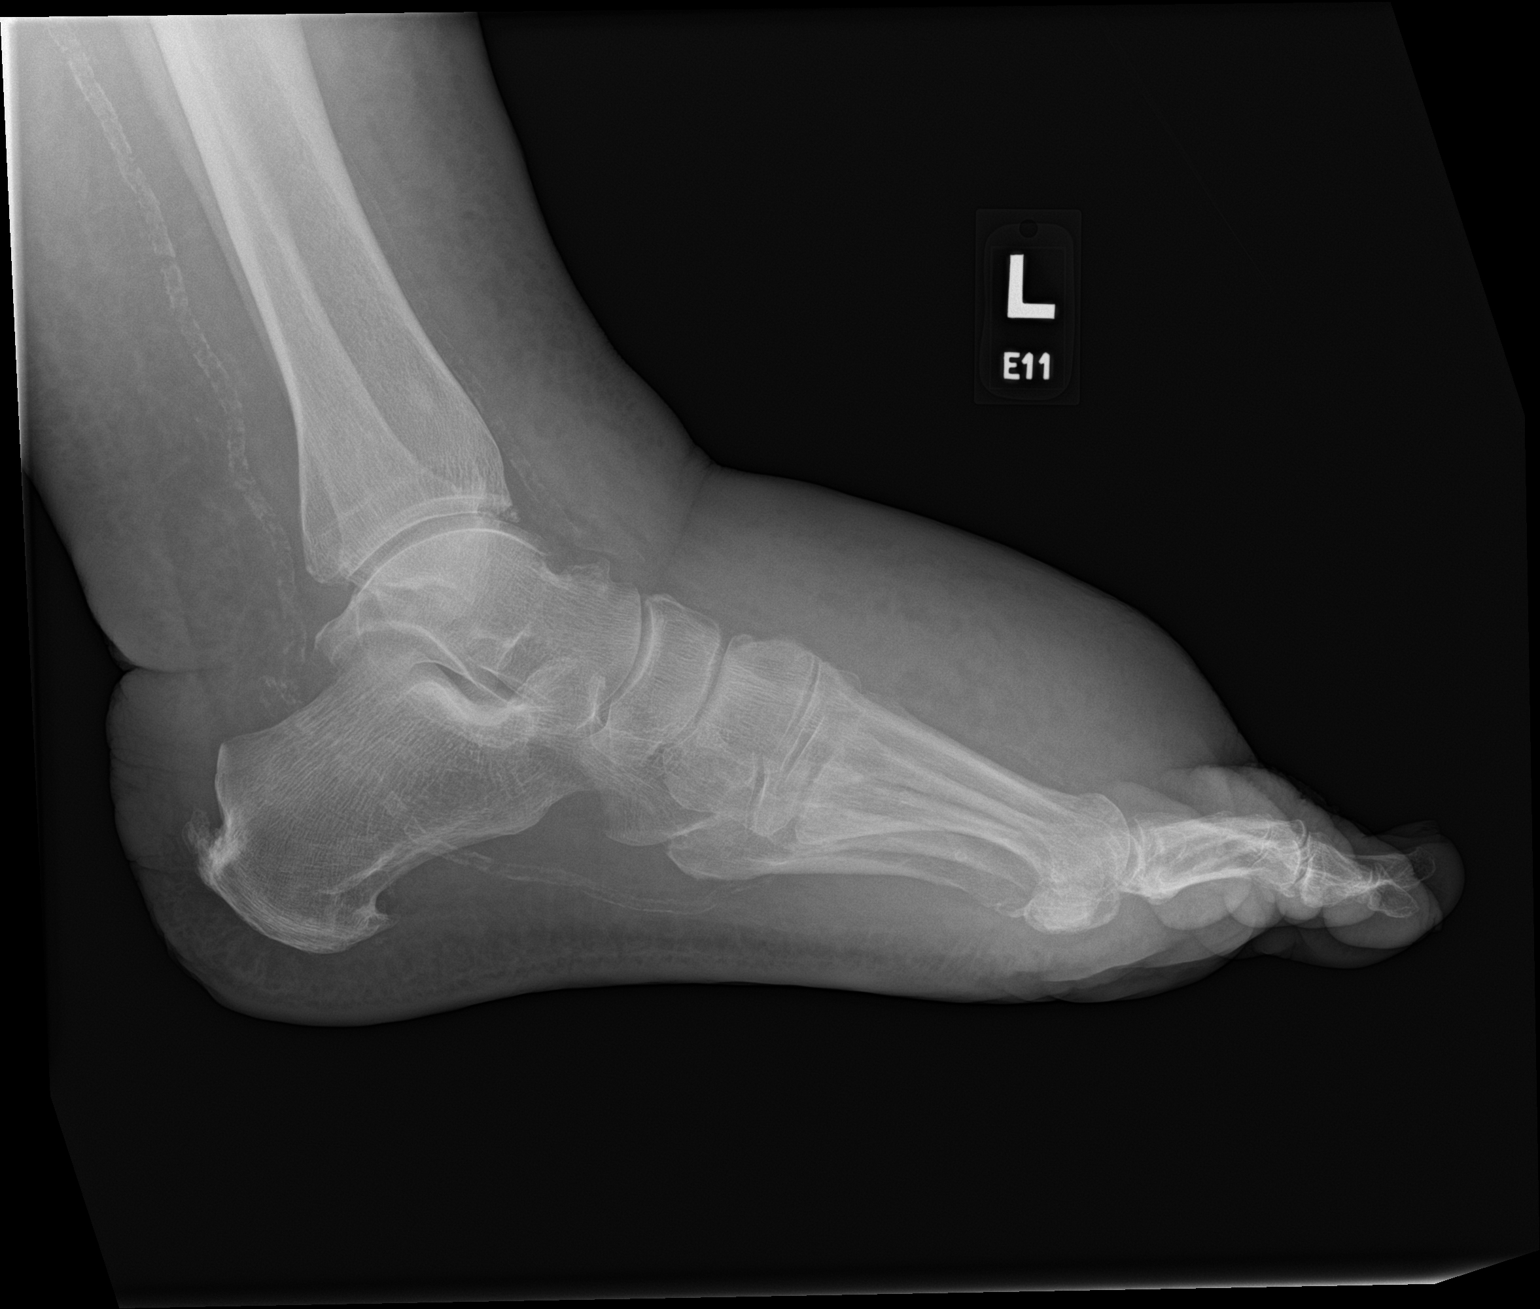

[3 of 3 positions shown; findings below may reference images not displayed]

FINDINGS: There is no evidence of an acute fracture or dislocation.
Periarticular osteopenia is seen involving the metatarsophalangeal
articulations of the second through fifth left toes. Mild to
moderate severity degenerative changes are seen along the dorsal
aspect of the mid left foot. There is marked severity vascular
calcification with marked severity diffuse soft tissue swelling.
This is most prominent along the dorsal aspect of the left ankle and
left foot.
IMPRESSION: Marked severity diffuse soft tissue swelling which is likely, in
part, secondary to a combination of diffuse cellulitis and the
patient's body habitus.

## 2022-08-08 DIAGNOSIS — E119 Type 2 diabetes mellitus without complications: Secondary | ICD-10-CM | POA: Diagnosis not present

## 2022-08-08 DIAGNOSIS — H26491 Other secondary cataract, right eye: Secondary | ICD-10-CM | POA: Diagnosis not present

## 2022-08-08 DIAGNOSIS — Z961 Presence of intraocular lens: Secondary | ICD-10-CM | POA: Diagnosis not present

## 2022-09-22 DIAGNOSIS — Z125 Encounter for screening for malignant neoplasm of prostate: Secondary | ICD-10-CM | POA: Diagnosis not present

## 2022-09-22 DIAGNOSIS — I10 Essential (primary) hypertension: Secondary | ICD-10-CM | POA: Diagnosis not present

## 2022-09-22 DIAGNOSIS — E538 Deficiency of other specified B group vitamins: Secondary | ICD-10-CM | POA: Diagnosis not present

## 2022-09-22 DIAGNOSIS — E781 Pure hyperglyceridemia: Secondary | ICD-10-CM | POA: Diagnosis not present

## 2022-09-22 DIAGNOSIS — E1129 Type 2 diabetes mellitus with other diabetic kidney complication: Secondary | ICD-10-CM | POA: Diagnosis not present

## 2022-10-07 DIAGNOSIS — R011 Cardiac murmur, unspecified: Secondary | ICD-10-CM | POA: Diagnosis not present

## 2022-10-07 DIAGNOSIS — R82998 Other abnormal findings in urine: Secondary | ICD-10-CM | POA: Diagnosis not present

## 2022-10-07 DIAGNOSIS — E1151 Type 2 diabetes mellitus with diabetic peripheral angiopathy without gangrene: Secondary | ICD-10-CM | POA: Diagnosis not present

## 2022-10-07 DIAGNOSIS — R972 Elevated prostate specific antigen [PSA]: Secondary | ICD-10-CM | POA: Diagnosis not present

## 2022-10-07 DIAGNOSIS — I10 Essential (primary) hypertension: Secondary | ICD-10-CM | POA: Diagnosis not present

## 2022-10-07 DIAGNOSIS — M1990 Unspecified osteoarthritis, unspecified site: Secondary | ICD-10-CM | POA: Diagnosis not present

## 2022-10-07 DIAGNOSIS — I872 Venous insufficiency (chronic) (peripheral): Secondary | ICD-10-CM | POA: Diagnosis not present

## 2022-10-07 DIAGNOSIS — Z6841 Body Mass Index (BMI) 40.0 and over, adult: Secondary | ICD-10-CM | POA: Diagnosis not present

## 2022-10-07 DIAGNOSIS — B372 Candidiasis of skin and nail: Secondary | ICD-10-CM | POA: Diagnosis not present

## 2022-10-07 DIAGNOSIS — R0989 Other specified symptoms and signs involving the circulatory and respiratory systems: Secondary | ICD-10-CM | POA: Diagnosis not present

## 2022-10-07 DIAGNOSIS — Z1331 Encounter for screening for depression: Secondary | ICD-10-CM | POA: Diagnosis not present

## 2022-10-07 DIAGNOSIS — Z0000 Encounter for general adult medical examination without abnormal findings: Secondary | ICD-10-CM | POA: Diagnosis not present

## 2022-10-07 DIAGNOSIS — I25119 Atherosclerotic heart disease of native coronary artery with unspecified angina pectoris: Secondary | ICD-10-CM | POA: Diagnosis not present

## 2022-10-07 DIAGNOSIS — Z1339 Encounter for screening examination for other mental health and behavioral disorders: Secondary | ICD-10-CM | POA: Diagnosis not present

## 2022-10-07 DIAGNOSIS — E1129 Type 2 diabetes mellitus with other diabetic kidney complication: Secondary | ICD-10-CM | POA: Diagnosis not present

## 2022-10-14 ENCOUNTER — Other Ambulatory Visit (HOSPITAL_COMMUNITY): Payer: Self-pay | Admitting: Internal Medicine

## 2022-10-14 ENCOUNTER — Ambulatory Visit (HOSPITAL_COMMUNITY)
Admission: RE | Admit: 2022-10-14 | Discharge: 2022-10-14 | Disposition: A | Discharge status: Home / Self Care | Payer: PPO | Source: Ambulatory Visit | Attending: Vascular Surgery | Admitting: Vascular Surgery

## 2022-10-14 DIAGNOSIS — R0989 Other specified symptoms and signs involving the circulatory and respiratory systems: Secondary | ICD-10-CM

## 2022-10-15 ENCOUNTER — Other Ambulatory Visit (HOSPITAL_COMMUNITY): Payer: Self-pay | Admitting: Internal Medicine

## 2022-10-15 DIAGNOSIS — R011 Cardiac murmur, unspecified: Secondary | ICD-10-CM

## 2022-11-05 ENCOUNTER — Ambulatory Visit (HOSPITAL_COMMUNITY): Payer: PPO | Attending: Internal Medicine

## 2022-11-05 DIAGNOSIS — R011 Cardiac murmur, unspecified: Secondary | ICD-10-CM | POA: Diagnosis not present

## 2022-11-05 LAB — ECHOCARDIOGRAM COMPLETE
Area-P 1/2: 2.69 cm2
S' Lateral: 2.9 cm

## 2023-08-10 DIAGNOSIS — E119 Type 2 diabetes mellitus without complications: Secondary | ICD-10-CM | POA: Diagnosis not present

## 2023-08-10 DIAGNOSIS — Z961 Presence of intraocular lens: Secondary | ICD-10-CM | POA: Diagnosis not present

## 2023-08-10 DIAGNOSIS — H26491 Other secondary cataract, right eye: Secondary | ICD-10-CM | POA: Diagnosis not present

## 2023-10-21 DIAGNOSIS — Z0189 Encounter for other specified special examinations: Secondary | ICD-10-CM | POA: Diagnosis not present

## 2023-10-21 DIAGNOSIS — E538 Deficiency of other specified B group vitamins: Secondary | ICD-10-CM | POA: Diagnosis not present

## 2023-10-21 DIAGNOSIS — Z125 Encounter for screening for malignant neoplasm of prostate: Secondary | ICD-10-CM | POA: Diagnosis not present

## 2023-10-21 DIAGNOSIS — E1129 Type 2 diabetes mellitus with other diabetic kidney complication: Secondary | ICD-10-CM | POA: Diagnosis not present

## 2023-10-21 DIAGNOSIS — E781 Pure hyperglyceridemia: Secondary | ICD-10-CM | POA: Diagnosis not present

## 2023-10-21 DIAGNOSIS — I10 Essential (primary) hypertension: Secondary | ICD-10-CM | POA: Diagnosis not present

## 2023-10-28 DIAGNOSIS — Z0000 Encounter for general adult medical examination without abnormal findings: Secondary | ICD-10-CM | POA: Diagnosis not present

## 2023-10-28 DIAGNOSIS — I25119 Atherosclerotic heart disease of native coronary artery with unspecified angina pectoris: Secondary | ICD-10-CM | POA: Diagnosis not present

## 2023-10-28 DIAGNOSIS — R0989 Other specified symptoms and signs involving the circulatory and respiratory systems: Secondary | ICD-10-CM | POA: Diagnosis not present

## 2023-10-28 DIAGNOSIS — I872 Venous insufficiency (chronic) (peripheral): Secondary | ICD-10-CM | POA: Diagnosis not present

## 2023-10-28 DIAGNOSIS — Z1331 Encounter for screening for depression: Secondary | ICD-10-CM | POA: Diagnosis not present

## 2023-10-28 DIAGNOSIS — E1129 Type 2 diabetes mellitus with other diabetic kidney complication: Secondary | ICD-10-CM | POA: Diagnosis not present

## 2023-10-28 DIAGNOSIS — F39 Unspecified mood [affective] disorder: Secondary | ICD-10-CM | POA: Diagnosis not present

## 2023-10-28 DIAGNOSIS — I739 Peripheral vascular disease, unspecified: Secondary | ICD-10-CM | POA: Diagnosis not present

## 2023-10-28 DIAGNOSIS — R82998 Other abnormal findings in urine: Secondary | ICD-10-CM | POA: Diagnosis not present

## 2023-10-28 DIAGNOSIS — M1990 Unspecified osteoarthritis, unspecified site: Secondary | ICD-10-CM | POA: Diagnosis not present

## 2023-10-28 DIAGNOSIS — F1120 Opioid dependence, uncomplicated: Secondary | ICD-10-CM | POA: Diagnosis not present

## 2023-10-28 DIAGNOSIS — E781 Pure hyperglyceridemia: Secondary | ICD-10-CM | POA: Diagnosis not present

## 2023-10-28 DIAGNOSIS — Z6841 Body Mass Index (BMI) 40.0 and over, adult: Secondary | ICD-10-CM | POA: Diagnosis not present

## 2023-10-28 DIAGNOSIS — I10 Essential (primary) hypertension: Secondary | ICD-10-CM | POA: Diagnosis not present

## 2023-10-28 DIAGNOSIS — R972 Elevated prostate specific antigen [PSA]: Secondary | ICD-10-CM | POA: Diagnosis not present
